# Patient Record
Sex: Male | Born: 1970
Health system: Southern US, Community
[De-identification: ages and names within clinical notes are randomized; demographics above are authoritative.]

## PROBLEM LIST (undated history)

## (undated) DIAGNOSIS — T7840XA Allergy, unspecified, initial encounter: Secondary | ICD-10-CM

## (undated) DIAGNOSIS — E669 Obesity, unspecified: Secondary | ICD-10-CM

## (undated) DIAGNOSIS — J302 Other seasonal allergic rhinitis: Secondary | ICD-10-CM

## (undated) DIAGNOSIS — K219 Gastro-esophageal reflux disease without esophagitis: Secondary | ICD-10-CM

## (undated) DIAGNOSIS — M25561 Pain in right knee: Secondary | ICD-10-CM

## (undated) DIAGNOSIS — I10 Essential (primary) hypertension: Secondary | ICD-10-CM

## (undated) DIAGNOSIS — F419 Anxiety disorder, unspecified: Secondary | ICD-10-CM

## (undated) DIAGNOSIS — J189 Pneumonia, unspecified organism: Secondary | ICD-10-CM

## (undated) DIAGNOSIS — M25562 Pain in left knee: Secondary | ICD-10-CM

## (undated) DIAGNOSIS — E785 Hyperlipidemia, unspecified: Secondary | ICD-10-CM

## (undated) DIAGNOSIS — M1710 Unilateral primary osteoarthritis, unspecified knee: Secondary | ICD-10-CM

## (undated) HISTORY — DX: Unilateral primary osteoarthritis, unspecified knee: M17.10

## (undated) HISTORY — DX: Pneumonia, unspecified organism: J18.9

## (undated) HISTORY — DX: Other seasonal allergic rhinitis: J30.2

## (undated) HISTORY — DX: Hyperlipidemia, unspecified: E78.5

## (undated) HISTORY — DX: Anxiety disorder, unspecified: F41.9

## (undated) HISTORY — DX: Gastro-esophageal reflux disease without esophagitis: K21.9

## (undated) HISTORY — DX: Obesity, unspecified: E66.9

## (undated) HISTORY — DX: Pain in left knee: M25.562

## (undated) HISTORY — DX: Essential (primary) hypertension: I10

## (undated) HISTORY — DX: Allergy, unspecified, initial encounter: T78.40XA

## (undated) HISTORY — DX: Pain in right knee: M25.561

---
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
## 2011-03-22 ENCOUNTER — Ambulatory Visit (INDEPENDENT_AMBULATORY_CARE_PROVIDER_SITE_OTHER): Payer: 59

## 2011-03-22 ENCOUNTER — Inpatient Hospital Stay (INDEPENDENT_AMBULATORY_CARE_PROVIDER_SITE_OTHER)
Admission: RE | Admit: 2011-03-22 | Discharge: 2011-03-22 | Disposition: A | Discharge status: Home / Self Care | Payer: 59 | Source: Ambulatory Visit | Attending: Family Medicine | Admitting: Family Medicine

## 2011-03-22 DIAGNOSIS — IMO0002 Reserved for concepts with insufficient information to code with codable children: Secondary | ICD-10-CM

## 2011-03-22 DIAGNOSIS — S61209A Unspecified open wound of unspecified finger without damage to nail, initial encounter: Secondary | ICD-10-CM

## 2012-01-17 ENCOUNTER — Ambulatory Visit: Payer: 59 | Admitting: Family Medicine

## 2012-01-24 ENCOUNTER — Ambulatory Visit (INDEPENDENT_AMBULATORY_CARE_PROVIDER_SITE_OTHER): Payer: 59 | Admitting: Family Medicine

## 2012-01-24 ENCOUNTER — Ambulatory Visit: Payer: 59 | Admitting: Family Medicine

## 2012-01-24 ENCOUNTER — Encounter: Payer: Self-pay | Admitting: Family Medicine

## 2012-01-24 VITALS — BP 120/86 | HR 105 | Temp 98.6°F | Ht 70.0 in | Wt 221.8 lb

## 2012-01-24 DIAGNOSIS — R000 Tachycardia, unspecified: Secondary | ICD-10-CM

## 2012-01-24 DIAGNOSIS — IMO0001 Reserved for inherently not codable concepts without codable children: Secondary | ICD-10-CM | POA: Insufficient documentation

## 2012-01-24 DIAGNOSIS — M1710 Unilateral primary osteoarthritis, unspecified knee: Secondary | ICD-10-CM

## 2012-01-24 DIAGNOSIS — E66811 Obesity, class 1: Secondary | ICD-10-CM | POA: Insufficient documentation

## 2012-01-24 DIAGNOSIS — J302 Other seasonal allergic rhinitis: Secondary | ICD-10-CM

## 2012-01-24 DIAGNOSIS — J309 Allergic rhinitis, unspecified: Secondary | ICD-10-CM

## 2012-01-24 DIAGNOSIS — K429 Umbilical hernia without obstruction or gangrene: Secondary | ICD-10-CM

## 2012-01-24 DIAGNOSIS — M25561 Pain in right knee: Secondary | ICD-10-CM | POA: Insufficient documentation

## 2012-01-24 DIAGNOSIS — E669 Obesity, unspecified: Secondary | ICD-10-CM

## 2012-01-24 MED ORDER — EPINEPHRINE 0.3 MG/0.3ML IJ DEVI: 0.3000 mg | Freq: Once | INTRAMUSCULAR | Status: DC

## 2012-01-24 NOTE — Assessment & Plan Note (Signed)
followed by [facility] [facility]. Has been rec against surgery. Continue aleve, tylenol prn

## 2012-01-24 NOTE — Assessment & Plan Note (Signed)
Check blood work when returns fasting. rec continue weight watchers.  Has lost 40lbs on this program. Body mass index is 31.82 kg/(m^2).

## 2012-01-24 NOTE — Patient Instructions (Signed)
For umbilical hernia- pass by Francisco Mullen's office for referral to surgery. No lifting anything more than 5-10 lbs until seen by surgery, take it easy until seen by them. Back off caffeine. Good job with weight, continue weight watchers. Return at your convenience fasting for blood work to check cholesterol and sugar. Great to meet you today, call us with questions.
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
## 2012-01-24 NOTE — Assessment & Plan Note (Signed)
Stable, continue to monitor  ?

## 2012-01-24 NOTE — Assessment & Plan Note (Signed)
Mild today. rec start by backing off caffeine.

## 2012-01-24 NOTE — Assessment & Plan Note (Signed)
Umbilical hernia present, reducible but tender - will refer to surgery for eval.  Likely will need repair.

## 2012-01-25 ENCOUNTER — Telehealth: Payer: Self-pay | Admitting: Family Medicine

## 2012-01-25 MED ORDER — TRAMADOL HCL 50 MG PO TABS
50.0000 mg | ORAL_TABLET | Freq: Two times a day (BID) | ORAL | Status: AC | PRN
Start: 2012-01-25 — End: 2012-02-04

## 2012-01-25 NOTE — Telephone Encounter (Signed)
plz check with pt - I'd like to review [facility] notes, but will likely feel comfortable prescribing narcotics for knee pain, unless pt definitely wants to establish with pain management. If wants [place] to follow knee pain, i'd like him to try tramadol first for knee pain instead of oxycodone - have sent in.

## 2012-01-25 NOTE — Telephone Encounter (Signed)
While scheduling the patients Surgeon consult to [facility] he asked about asking you to refer him for Pain Management to [facility] for Pain for his arthritic knees. He went to [facility] for 1 visit with xrays and the Dr there said he is too young for knee replacement. Having a lot of pain and could not take the Meloxicam that was prescribed for him. Have sent to [facility] for that Drs notes so we can send to the pain center as they will need these records.

## 2012-01-25 NOTE — Telephone Encounter (Signed)
Message left advising that you would like to see ortho notes, but that if you felt comfortable managing the pain afterwards, you would unless he specifically wants to go to pain management. Advised to let me know one way or the other how he would like to proceed. Advised tramadol was at pharmacy and to schedule a follow up here if he starts taking it.

## 2012-01-27 ENCOUNTER — Encounter: Payer: Self-pay | Admitting: Family Medicine

## 2012-01-31 ENCOUNTER — Telehealth: Payer: Self-pay | Admitting: *Deleted

## 2012-01-31 ENCOUNTER — Ambulatory Visit (INDEPENDENT_AMBULATORY_CARE_PROVIDER_SITE_OTHER): Payer: Commercial Managed Care - PPO | Admitting: General Surgery

## 2012-01-31 ENCOUNTER — Encounter (INDEPENDENT_AMBULATORY_CARE_PROVIDER_SITE_OTHER): Payer: Self-pay | Admitting: General Surgery

## 2012-01-31 VITALS — BP 150/96 | Ht 70.0 in | Wt 219.0 lb

## 2012-01-31 DIAGNOSIS — K429 Umbilical hernia without obstruction or gangrene: Secondary | ICD-10-CM
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
## 2012-01-31 NOTE — Telephone Encounter (Signed)
FMLA paperwork dropped off for patient's wife for pending hernia surgery. Paperwork in your IN box.

## 2012-01-31 NOTE — Telephone Encounter (Signed)
Filled and placed in my out box. 

## 2012-02-01 NOTE — Telephone Encounter (Signed)
Message left notifying patient's wife that paperwork was complete and ready for up. Copy made and original placed up front for pick up.

## 2012-02-13 ENCOUNTER — Ambulatory Visit (INDEPENDENT_AMBULATORY_CARE_PROVIDER_SITE_OTHER): Payer: Self-pay | Admitting: General Surgery

## 2012-02-15 ENCOUNTER — Encounter (HOSPITAL_BASED_OUTPATIENT_CLINIC_OR_DEPARTMENT_OTHER): Payer: Self-pay | Admitting: *Deleted

## 2012-02-15 NOTE — Progress Notes (Signed)
No labs needed

## 2012-02-20 ENCOUNTER — Encounter (HOSPITAL_BASED_OUTPATIENT_CLINIC_OR_DEPARTMENT_OTHER): Payer: Self-pay | Admitting: *Deleted

## 2012-02-20 ENCOUNTER — Encounter (HOSPITAL_BASED_OUTPATIENT_CLINIC_OR_DEPARTMENT_OTHER)
Admission: RE | Disposition: A | Discharge status: Home / Self Care | Payer: Self-pay | Source: Ambulatory Visit | Attending: General Surgery

## 2012-02-20 ENCOUNTER — Encounter (HOSPITAL_BASED_OUTPATIENT_CLINIC_OR_DEPARTMENT_OTHER): Payer: Self-pay | Admitting: Anesthesiology

## 2012-02-20 ENCOUNTER — Ambulatory Visit (HOSPITAL_BASED_OUTPATIENT_CLINIC_OR_DEPARTMENT_OTHER)
Admission: RE | Admit: 2012-02-20 | Discharge: 2012-02-20 | Disposition: A | Discharge status: Home / Self Care | Payer: 59 | Source: Ambulatory Visit | Attending: General Surgery | Admitting: General Surgery

## 2012-02-20 ENCOUNTER — Ambulatory Visit (HOSPITAL_BASED_OUTPATIENT_CLINIC_OR_DEPARTMENT_OTHER): Payer: 59 | Admitting: Anesthesiology

## 2012-02-20 DIAGNOSIS — Z87891 Personal history of nicotine dependence: Secondary | ICD-10-CM | POA: Insufficient documentation

## 2012-02-20 DIAGNOSIS — E669 Obesity, unspecified: Secondary | ICD-10-CM | POA: Insufficient documentation

## 2012-02-20 DIAGNOSIS — K429 Umbilical hernia without obstruction or gangrene: Secondary | ICD-10-CM

## 2012-02-20 HISTORY — PX: UMBILICAL HERNIA REPAIR: SHX196

## 2012-02-20 SURGERY — REPAIR, HERNIA, UMBILICAL, ADULT
Anesthesia: General | Site: Abdomen | Wound class: Clean

## 2012-02-20 MED ORDER — MIDAZOLAM HCL 5 MG/5ML IJ SOLN
INTRAMUSCULAR | Status: DC | PRN
  Administered 2012-02-20: 2 mg via INTRAVENOUS

## 2012-02-20 MED ORDER — CIPROFLOXACIN IN D5W 400 MG/200ML IV SOLN
400.0000 mg | INTRAVENOUS | Status: AC
  Administered 2012-02-20: 400 mg via INTRAVENOUS

## 2012-02-20 MED ORDER — FENTANYL CITRATE 0.05 MG/ML IJ SOLN
INTRAMUSCULAR | Status: DC | PRN
  Administered 2012-02-20: 25 ug via INTRAVENOUS
  Administered 2012-02-20: 100 ug via INTRAVENOUS
  Administered 2012-02-20: 25 ug via INTRAVENOUS

## 2012-02-20 MED ORDER — PROPOFOL 10 MG/ML IV EMUL
INTRAVENOUS | Status: DC | PRN
  Administered 2012-02-20: 200 mg via INTRAVENOUS

## 2012-02-20 MED ORDER — METOCLOPRAMIDE HCL 5 MG/ML IJ SOLN
INTRAMUSCULAR | Status: DC | PRN
  Administered 2012-02-20: 10 mg via INTRAVENOUS

## 2012-02-20 MED ORDER — HYDROCODONE-ACETAMINOPHEN 5-325 MG PO TABS
1.0000 | ORAL_TABLET | Freq: Once | ORAL | Status: AC
  Administered 2012-02-20: 1 via ORAL

## 2012-02-20 MED ORDER — ONDANSETRON HCL 4 MG/2ML IJ SOLN
INTRAMUSCULAR | Status: DC | PRN
  Administered 2012-02-20: 4 mg via INTRAVENOUS

## 2012-02-20 MED ORDER — DEXAMETHASONE SODIUM PHOSPHATE 4 MG/ML IJ SOLN
INTRAMUSCULAR | Status: DC | PRN
  Administered 2012-02-20: 10 mg via INTRAVENOUS

## 2012-02-20 MED ORDER — LACTATED RINGERS IV SOLN
INTRAVENOUS | Status: DC
  Administered 2012-02-20: 10:00:00 via INTRAVENOUS

## 2012-02-20 MED ORDER — BUPIVACAINE HCL (PF) 0.5 % IJ SOLN
INTRAMUSCULAR | Status: DC | PRN
  Administered 2012-02-20: 10 mL

## 2012-02-20 MED ORDER — SODIUM CHLORIDE 0.9 % IR SOLN
Status: DC | PRN
  Administered 2012-02-20: 11:00:00

## 2012-02-20 MED ORDER — LACTATED RINGERS IV SOLN
INTRAVENOUS | Status: DC | PRN
  Administered 2012-02-20 (×2): via INTRAVENOUS

## 2012-02-20 MED ORDER — HYDROCODONE-ACETAMINOPHEN 5-500 MG PO TABS: 1.0000 | ORAL_TABLET | Freq: Four times a day (QID) | ORAL | Status: AC | PRN

## 2012-02-20 MED ORDER — HYDROMORPHONE HCL PF 1 MG/ML IJ SOLN
0.2500 mg | INTRAMUSCULAR | Status: DC | PRN
  Administered 2012-02-20 (×3): 0.5 mg via INTRAVENOUS

## 2012-02-20 MED ORDER — ONDANSETRON HCL 4 MG/2ML IJ SOLN: 4.0000 mg | Freq: Four times a day (QID) | INTRAMUSCULAR | Status: DC | PRN

## 2012-02-20 SURGICAL SUPPLY — 63 items
BAG DECANTER FOR FLEXI CONT (MISCELLANEOUS) ×2 IMPLANT
BINDER ABD UNIV 12 30-45 (MISCELLANEOUS) ×1 IMPLANT
BINDER ABDOMINAL 12 (MISCELLANEOUS) ×2
BLADE SURG 10 STRL SS (BLADE) ×2 IMPLANT
BLADE SURG 15 STRL LF DISP TIS (BLADE) ×1 IMPLANT
BLADE SURG 15 STRL SS (BLADE) ×1
BLADE SURG ROTATE 9660 (MISCELLANEOUS) ×2 IMPLANT
CANISTER SUCTION 1200CC (MISCELLANEOUS) IMPLANT
CHLORAPREP W/TINT 26ML (MISCELLANEOUS) ×2 IMPLANT
CLEANER CAUTERY TIP 5X5 PAD (MISCELLANEOUS) ×1 IMPLANT
CLOTH BEACON ORANGE TIMEOUT ST (SAFETY) ×2 IMPLANT
CONT SPEC 4OZ CLIKSEAL STRL BL (MISCELLANEOUS) IMPLANT
COVER MAYO STAND STRL (DRAPES) ×2 IMPLANT
COVER TABLE BACK 60X90 (DRAPES) ×2 IMPLANT
DECANTER SPIKE VIAL GLASS SM (MISCELLANEOUS) IMPLANT
DERMABOND ADVANCED (GAUZE/BANDAGES/DRESSINGS) ×1
DERMABOND ADVANCED .7 DNX12 (GAUZE/BANDAGES/DRESSINGS) ×1 IMPLANT
DRAPE LAPAROTOMY TRNSV 102X78 (DRAPE) ×2 IMPLANT
DRAPE UTILITY XL STRL (DRAPES) ×4 IMPLANT
DRSG TEGADERM 2-3/8X2-3/4 SM (GAUZE/BANDAGES/DRESSINGS) IMPLANT
DRSG TEGADERM 4X4.75 (GAUZE/BANDAGES/DRESSINGS) ×2 IMPLANT
ELECT REM PT RETURN 9FT ADLT (ELECTROSURGICAL) ×2
ELECTRODE REM PT RTRN 9FT ADLT (ELECTROSURGICAL) ×1 IMPLANT
GLOVE BIO SURGEON STRL SZ 6.5 (GLOVE) ×2 IMPLANT
GLOVE BIOGEL PI IND STRL 8 (GLOVE) ×1 IMPLANT
GLOVE BIOGEL PI INDICATOR 8 (GLOVE) ×1
GLOVE ECLIPSE 7.5 STRL STRAW (GLOVE) ×2 IMPLANT
GOWN PREVENTION PLUS XLARGE (GOWN DISPOSABLE) ×4 IMPLANT
NEEDLE HYPO 25X1 1.5 SAFETY (NEEDLE) ×2 IMPLANT
NS IRRIG 1000ML POUR BTL (IV SOLUTION) IMPLANT
PACK BASIN DAY SURGERY FS (CUSTOM PROCEDURE TRAY) ×2 IMPLANT
PAD CLEANER CAUTERY TIP 5X5 (MISCELLANEOUS) ×1
PATCH VENTRAL SMALL 4.3 (Mesh Specialty) ×2 IMPLANT
PENCIL BUTTON HOLSTER BLD 10FT (ELECTRODE) ×2 IMPLANT
SLEEVE SCD COMPRESS KNEE MED (MISCELLANEOUS) ×2 IMPLANT
SPONGE INTESTINAL PEANUT (DISPOSABLE) IMPLANT
SPONGE LAP 4X18 X RAY DECT (DISPOSABLE) ×2 IMPLANT
STAPLER VISISTAT 35W (STAPLE) IMPLANT
STRIP CLOSURE SKIN 1/2X4 (GAUZE/BANDAGES/DRESSINGS) ×2 IMPLANT
SUT ETHIBOND 0 MO6 C/R (SUTURE) IMPLANT
SUT MNCRL AB 4-0 PS2 18 (SUTURE) ×2 IMPLANT
SUT NOVA NAB DX-16 0-1 5-0 T12 (SUTURE) IMPLANT
SUT NOVA NAB GS-21 0 18 T12 DT (SUTURE) ×2 IMPLANT
SUT PROLENE 0 SH 30 (SUTURE) IMPLANT
SUT PROLENE 1 CT (SUTURE) IMPLANT
SUT VIC AB 3-0 FS2 27 (SUTURE) IMPLANT
SUT VIC AB 3-0 SH 27 (SUTURE) ×2
SUT VIC AB 3-0 SH 27X BRD (SUTURE) ×2 IMPLANT
SUT VIC AB 4-0 RB1 27 (SUTURE)
SUT VIC AB 4-0 RB1 27X BRD (SUTURE) IMPLANT
SUT VIC AB 4-0 SH 27 (SUTURE)
SUT VIC AB 4-0 SH 27XANBCTRL (SUTURE) IMPLANT
SUT VIC AB 5-0 PS2 18 (SUTURE) IMPLANT
SUT VICRYL 4-0 PS2 18IN ABS (SUTURE) IMPLANT
SUT VICRYL AB 2 0 TIE (SUTURE) IMPLANT
SUT VICRYL AB 2 0 TIES (SUTURE)
SYR BULB 3OZ (MISCELLANEOUS) IMPLANT
SYR CONTROL 10ML LL (SYRINGE) ×2 IMPLANT
TOWEL OR 17X24 6PK STRL BLUE (TOWEL DISPOSABLE) IMPLANT
TOWEL OR NON WOVEN STRL DISP B (DISPOSABLE) ×2 IMPLANT
TUBE CONNECTING 20X1/4 (TUBING) IMPLANT
WATER STERILE IRR 1000ML POUR (IV SOLUTION) IMPLANT
YANKAUER SUCT BULB TIP NO VENT (SUCTIONS) IMPLANT

## 2012-02-20 NOTE — Anesthesia Procedure Notes (Signed)
Procedure Name: LMA Insertion Date/Time: 02/20/2012 10:54 AM Performed by: Caren Macadam Pre-anesthesia Checklist: Patient identified, Emergency Drugs available, Suction available and Patient being monitored Patient Re-evaluated:Patient Re-evaluated prior to inductionOxygen Delivery Method: Circle System Utilized Preoxygenation: Pre-oxygenation with 100% oxygen Intubation Type: IV induction Ventilation: Mask ventilation without difficulty LMA: LMA inserted LMA Size: 4.0 Number of attempts: 1 Airway Equipment and Method: bite block Placement Confirmation: positive ETCO2 and breath sounds checked- equal and bilateral Tube secured with: Tape Dental Injury: Teeth and Oropharynx as per pre-operative assessment

## 2012-02-20 NOTE — Transfer of Care (Signed)
Immediate Anesthesia Transfer of Care Note  Patient: Francisco Mullen  Procedure(s) Performed: Procedure(s) (LRB): HERNIA REPAIR UMBILICAL ADULT (N/A) INSERTION OF MESH (N/A)  Patient Location: PACU  Anesthesia Type: General  Level of Consciousness: awake and alert   Airway & Oxygen Therapy: Patient Spontanous Breathing and Patient connected to face mask oxygen  Post-op Assessment: Report given to PACU RN and Post -op Vital signs reviewed and stable  Post vital signs: Reviewed and stable  Complications: No apparent anesthesia complications

## 2012-02-20 NOTE — Discharge Instructions (Addendum)
Umbilical Herniorrhaphy A herniorrhaphy is surgery to repair a hernia. A hernia is a gap or weakness in the muscles of your abdomen. Umbilical means that your hernia is in the area around your belly button. You might be able to feel a small bulge in your abdomen where the hernia is. You might also have pain or discomfort. If the hernia is not repaired, the gap could get bigger. Your intestines could get trapped in the gap. This can be painful. It also can lead to other health problems, such as blocked intestines. LET YOUR CAREGIVER KNOW ABOUT:  Any allergies.   All medications you are taking, including:   Herbs, eyedrops, over-the-counter medications and cream   Blood thinners (anticoagulants), aspirin or other drugs that could affect blood clotting.   Use of steroids (by mouth or as creams).   Previous problems with anesthetics, including local anesthetics.   Possibility of pregnancy, if this applies.   Any history of blood clots.   Any history of bleeding or other blood problems.   Previous surgery.   Smoking history.   Other health problems.  RISKS AND COMPLICATIONS  Short-term possibilities include:   Pain.   Excessive bleeding.   Hematoma. This is a pooling of blood under the wound.   Infection at the surgery site or of the mesh.   Numbness at the surgery site.   Swelling and bruising.   Slow healing.   Blood clots.   Intestine or bowel damage. This is rare.   Longer-term possibilities include:   Scarring.   Skin damage.   The need for additional surgery.   Another hernia.  BEFORE THE PROCEDURE  Stop using aspirin and non-steroidal anti-inflammatory drugs (NSAIDs) for pain relief. Also stop taking vitamin E. If possible, do this two weeks in advance.   If you take blood-thinners, ask your healthcare provider when you should stop taking them.   Do not eat or drink for about 8 hours before your surgery.   You might be asked to shower or wash with a  special antibacterial soap before the procedure.   Wear clothes that will be easy to put on after the surgery.   Arrive at least an hour before the surgery, or whenever your surgeon recommends. This will give you time to check in and fill out any needed paperwork.   This surgery is usually an outpatient procedure, so you will be able to go home the same day. Less often people stay overnight in the hospital after the procedure. Ask your healthcare provider what to expect. Either way, make arrangements in advance for someone to drive you home. After an outpatient procedure, you should have someone stay with you overnight.  PROCEDURE Your procedure can be done with traditional surgery. The surgeon opens the abdomen and repairs the hernia. Or, sometimes it can be done with laparoscopic surgery. Then the procedure is done through multiple small incisions, using a camera to guide the repair. Talk with your surgeon about how the hernia will be repaired.  The preparation:   You will change into a hospital gown.   You will be given an IV. A needle will be inserted in your arm. Medication can flow directly into your body through this needle.   You might be given a sedative to help you relax.   You may be given a drug that will put you to sleep during the surgery (general anesthetic). Or, your abdomen will be numbed, and you will be drowsy but awake (local   anesthetic).   For a traditional surgery (sometimes called open surgery):   Once you are pain-free, the surgeon will make a small cut (incision) in your abdomen.   The gap in the muscle wall will be repaired. The surgeon could sew muscle together over the gap. Or, a mesh or soft screen material can be used to strengthen the area. The mesh acts as a scaffolding and the body grows new strong tissue into and around it. This new tissue is what closes the gap of the hernia and prevents it from coming back.   A drain might be put in. Fluid sometimes  collects under the wound as it heals. The drain helps get the fluid out of the area. A drain will probably be used if your hernia is large.   The surgeon will close the incision with small stitches.   For a laparoscopic surgery:   One you are pain-free, the surgeon will make a small incision in your abdomen.   A thin tube with a tiny camera attached to it will be inserted into the abdomen through the incision. What the camera "sees" is projected on a screen in the room. This gives the surgeon a good view of the hernia.   Other small incisions will be made so the surgeon can insert small tools that are used to repair the hernia.   The incisions will be closed with small stitches.  AFTER THE PROCEDURE  You will be stay in a recovery area until the anesthesia has worn off. Your blood pressure and pulse will be checked every so often.   You might be asked to get up and try walking.   Sometimes people can go home the same day. For others, an overnight stay is needed.  HOME CARE INSTRUCTIONS   Take any medication that your surgeon prescribed. Follow the directions carefully. Take all of the medication.   Ask your surgeon whether you can take over-the-counter medicines for pain, discomfort or fever. Do not take aspirin unless your healthcare team says that you should. Aspirin increases the chances of bleeding.   Do not get the incision area wet for the first few days after surgery (or until your surgeon says it is OK).   Avoid lifting heavy objects (more than 10 lbs, 4.5 kilograms) for 6 to 8 weeks after your surgery.   Expect some pain when climbing stairs for several days after surgery.   Avoid sexual activity for a few weeks. It can be uncomfortable or painful.   You should be able to drive within a few days. However, do not drive until you are no longer taking pain medicine. It can make you drowsy. It also can slow your reaction time.   You should be able to resume normal activity in  a few days. When you can return to work will depend on the type of work you do. You can go back to a desk job much sooner than you can return to work that requires physical labor. Talk about this with your healthcare provider.  SEEK MEDICAL CARE IF:   You notice blood or fluid leaking from the wound.   The area around the incision becomes red or swollen.   You are having problems urinating.   You become nauseous or throw up for more than two days after the surgery.   You develop a fever of more than 100.5 F (38.1 C).   Wear abdominal binder at all times while walking. SEEK IMMEDIATE MEDICAL CARE  IF:  You develop a fever of more than 102.0 F (38.9 C). Document Released: 01/06/2009 Document Revised: 09/29/2011 Document Reviewed: 01/06/2009 [facility] Patient Information 2012 [place], [place].   Post Anesthesia Home Care Instructions  Activity: Get plenty of rest for the remainder of the day. A responsible adult should stay with you for 24 hours following the procedure.  For the next 24 hours, DO NOT: -Drive a car -Advertising copywriter -Drink alcoholic beverages -Take any medication unless instructed by your physician -Make any legal decisions or sign important papers.  Meals: Start with liquid foods such as gelatin or soup. Progress to regular foods as tolerated. Avoid greasy, spicy, heavy foods. If nausea and/or vomiting occur, drink only clear liquids until the nausea and/or vomiting subsides. Call your physician if vomiting continues.  Special Instructions/Symptoms: Your throat may feel dry or sore from the anesthesia or the breathing tube placed in your throat during surgery. If this causes discomfort, gargle with warm salt water. The discomfort should disappear within 24 hours.

## 2012-02-20 NOTE — Op Note (Signed)
OPERATIVE REPORT  DATE OF OPERATION: 02/20/2012  PATIENT:  Francisco Mullen  41 y.Mullen. male  PRE-OPERATIVE DIAGNOSIS:  Symptomatic umbilical hernia  POST-OPERATIVE DIAGNOSIS:  Symptomatic umbilical hernia  PROCEDURE:  Procedure(s): HERNIA REPAIR UMBILICAL ADULT INSERTION OF MESH (4.3 cm Circular PROCEED Mesh)  SURGEON:  Surgeon(s): Cherylynn Ridges, MD  ASSISTANT: None   ANESTHESIA:   general  EBL: <30 ml  BLOOD ADMINISTERED: none  DRAINS: none   SPECIMEN:  No Specimen  COUNTS CORRECT:  YES  PROCEDURE DETAILS: The patient was taken to the operating room and placed on the table in the supine position. After an adequate general laryngeal airway anesthetic was administered he was prepped and draped in usual sterile manner exposing the midline.  An infraumbilical 5 cm long transverse incision was made using a #15 blade. It was taken down to subcutaneous tissue. The umbilical hernia was just underneath the umbilicus itself. We dissected this area free. I dissected the hernia sac which was attached to the bottom side of the umbilicus circumferentially. We then entered the sac and reduced the omentum which is contained within. We detached the omental attachments on the anterior surface of the abdominal wall. The edges of the fascia measured about 1.5-2 cm in size.  The hernia defect was fixed using a 4.3 cm circular patch of Proceed. It was attached to the underneath portion of the fascia using interrupted all Novafil sutures. Once this was done we closed the fascia on top of it after cutting off the flanges of the patch.  Once this was done the mesh which been soaked in antibiotic solution all was inspected and appeared to be fine. We irrigated the wound was antibiotic solution. We closed the subcutaneous tissue using 3-0 Vicryl sutures and the skin was closed using running subcuticular stitch of 4-0 Monocryl. 0.50 % Marcaine without epinephrine was injected into subcutaneous tissue prior  to closure. All counts were correct. Dermabond and Steri-Strips use complete the dressing  PATIENT DISPOSITION:  PACU - hemodynamically stable.   Francisco Mullen,Francisco Mullen 4/29/201311:52 AM

## 2012-02-20 NOTE — Anesthesia Postprocedure Evaluation (Signed)
Anesthesia Post Note  Patient: Francisco Mullen  Procedure(s) Performed: Procedure(s) (LRB): HERNIA REPAIR UMBILICAL ADULT (N/A) INSERTION OF MESH (N/A)  Anesthesia type: General  Patient location: PACU  Post pain: Pain level controlled and Adequate analgesia  Post assessment: Post-op Vital signs reviewed, Patient's Cardiovascular Status Stable, Respiratory Function Stable, Patent Airway and Pain level controlled  Last Vitals:  Filed Vitals:   02/20/12 1200  BP: 115/73  Pulse: 104  Temp:   Resp: 21    Post vital signs: Reviewed and stable  Level of consciousness: awake, alert  and oriented  Complications: No apparent anesthesia complications
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
## 2012-02-20 NOTE — Anesthesia Preprocedure Evaluation (Signed)
Anesthesia Evaluation  Patient identified by MRN, date of birth, ID band Patient awake    Reviewed: Allergy & Precautions, H&P , NPO status , Patient's Chart, lab work & pertinent test results  Airway Mallampati: II  Neck ROM: full    Dental   Pulmonary former smoker         Cardiovascular     Neuro/Psych    GI/Hepatic   Endo/Other    Renal/GU      Musculoskeletal  (+) Arthritis -,   Abdominal   Peds  Hematology   Anesthesia Other Findings   Reproductive/Obstetrics                           Anesthesia Physical Anesthesia Plan  ASA: II  Anesthesia Plan: General   Post-op Pain Management:    Induction: Intravenous  Airway Management Planned: Oral ETT  Additional Equipment:   Intra-op Plan:   Post-operative Plan: Extubation in OR  Informed Consent: I have reviewed the patients History and Physical, chart, labs and discussed the procedure including the risks, benefits and alternatives for the proposed anesthesia with the patient or authorized representative who has indicated his/her understanding and acceptance.     Plan Discussed with: CRNA and Surgeon  Anesthesia Plan Comments:         Anesthesia Quick Evaluation

## 2012-02-21 ENCOUNTER — Encounter (HOSPITAL_BASED_OUTPATIENT_CLINIC_OR_DEPARTMENT_OTHER): Payer: Self-pay | Admitting: General Surgery

## 2012-03-13 ENCOUNTER — Encounter (INDEPENDENT_AMBULATORY_CARE_PROVIDER_SITE_OTHER): Payer: Self-pay | Admitting: General Surgery

## 2012-03-13 ENCOUNTER — Ambulatory Visit (INDEPENDENT_AMBULATORY_CARE_PROVIDER_SITE_OTHER): Payer: Commercial Managed Care - PPO | Admitting: General Surgery

## 2012-03-13 VITALS — BP 162/78 | HR 76 | Temp 97.3°F | Resp 12 | Ht 70.0 in | Wt 226.2 lb

## 2012-03-13 DIAGNOSIS — Z09 Encounter for follow-up examination after completed treatment for conditions other than malignant neoplasm: Secondary | ICD-10-CM

## 2012-03-13 NOTE — Progress Notes (Signed)
HPI The patient is doing well status post umbilical hernia repair with mesh.  PE On examination he has no recurrence. There is some firmness and scarring in the supraumbilical area but no evidence of infection.  Study review None  Assessment Doing well status post open umbilical hernia repair with mesh.  Plan Return to see me on a p.r.n. basis. Device the patient that he can go back to full physical activity in one month.

## 2012-05-08 ENCOUNTER — Encounter: Payer: Self-pay | Admitting: Family Medicine

## 2012-05-08 ENCOUNTER — Ambulatory Visit (INDEPENDENT_AMBULATORY_CARE_PROVIDER_SITE_OTHER): Payer: 59 | Admitting: Family Medicine

## 2012-05-08 VITALS — BP 156/108 | HR 104 | Temp 97.8°F | Wt 230.5 lb

## 2012-05-08 DIAGNOSIS — R030 Elevated blood-pressure reading, without diagnosis of hypertension: Secondary | ICD-10-CM

## 2012-05-08 DIAGNOSIS — I10 Essential (primary) hypertension: Secondary | ICD-10-CM | POA: Insufficient documentation

## 2012-05-08 DIAGNOSIS — M1710 Unilateral primary osteoarthritis, unspecified knee: Secondary | ICD-10-CM

## 2012-05-08 DIAGNOSIS — K429 Umbilical hernia without obstruction or gangrene: Secondary | ICD-10-CM

## 2012-05-08 MED ORDER — HYDROCODONE-ACETAMINOPHEN 5-500 MG PO TABS: 1.0000 | ORAL_TABLET | Freq: Two times a day (BID) | ORAL | Status: DC | PRN
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
## 2012-05-08 NOTE — Patient Instructions (Addendum)
Schedule appointment with Dr. Jorge Mandril. vicodin for knee pain prescribed today.  Let me know how that is doing. Keep track of blood pressure at home.  If staying high despite better pain control, let me know. Return fasting for blood work. Good to see you today. Return in 3 months for follow up.

## 2012-05-08 NOTE — Assessment & Plan Note (Signed)
Has been elevated in past, also in pain. Will treat with vicodin, asked him to keep track at home of BP (wife can check) and update me if staying consistently elevated despite better pain control for initiation of antihypertensive.

## 2012-05-08 NOTE — Assessment & Plan Note (Signed)
S/p repair

## 2012-05-08 NOTE — Assessment & Plan Note (Signed)
Too young for knee replacement.  Has been evaluated by Dr. Jorge Mandril in past, advised reschedule checkup with him, discuss steroid shot vs visco-supplementation Has tried multiple NSAIDs as well as OTC meds.  Tramadol ineffective. I feel comfortable treating with narcotic, discussed dependence/tolerance of med. Will start with vicodin, rtc 3 mo for f/u.

## 2012-05-10 ENCOUNTER — Other Ambulatory Visit (INDEPENDENT_AMBULATORY_CARE_PROVIDER_SITE_OTHER): Payer: 59

## 2012-05-10 DIAGNOSIS — E669 Obesity, unspecified: Secondary | ICD-10-CM

## 2012-05-10 LAB — COMPREHENSIVE METABOLIC PANEL
ALT: 36 U/L (ref 0–53)
Alkaline Phosphatase: 60 U/L (ref 39–117)
Sodium: 140 mEq/L (ref 135–145)
Total Bilirubin: 0.6 mg/dL (ref 0.3–1.2)
Total Protein: 7.1 g/dL (ref 6.0–8.3)

## 2012-05-10 LAB — LIPID PANEL
HDL: 32.2 mg/dL — ABNORMAL LOW (ref >39.00)
Total CHOL/HDL Ratio: 6
Triglycerides: 376 mg/dL — ABNORMAL HIGH (ref 0.0–149.0)

## 2012-05-10 LAB — TSH: TSH: 3.63 u[IU]/mL (ref 0.35–5.50)

## 2012-06-01 ENCOUNTER — Other Ambulatory Visit: Payer: Self-pay

## 2012-06-01 MED ORDER — HYDROCODONE-ACETAMINOPHEN 5-500 MG PO TABS: 1.0000 | ORAL_TABLET | Freq: Two times a day (BID) | ORAL | Status: DC | PRN

## 2012-06-01 NOTE — Telephone Encounter (Signed)
plz phone in and notify pt. 

## 2012-06-01 NOTE — Telephone Encounter (Signed)
pts wife request refill hydrocodone to [facility] [address] on Monday.Please advise.

## 2012-06-04 NOTE — Telephone Encounter (Signed)
Rx called in as directed.   

## 2012-06-05 ENCOUNTER — Other Ambulatory Visit: Payer: Self-pay

## 2012-06-05 NOTE — Telephone Encounter (Signed)
pts wife left v/m requesting refill Vicodin. Spoke wit Missy at [facility] [address].  Vicodin called in 06/04/12 and is ready for pick upl. Left v/m for pt or pts wife to call back.

## 2012-06-07 NOTE — Telephone Encounter (Signed)
Spoke with pt he did get med at [facility].

## 2012-07-04 ENCOUNTER — Other Ambulatory Visit: Payer: Self-pay

## 2012-07-04 MED ORDER — HYDROCODONE-ACETAMINOPHEN 5-500 MG PO TABS: 1.0000 | ORAL_TABLET | Freq: Two times a day (BID) | ORAL | Status: AC | PRN

## 2012-07-04 NOTE — Telephone Encounter (Signed)
plz phone in. Has he scheduled recheck with Dr. Jorge Mandril?

## 2012-07-04 NOTE — Telephone Encounter (Signed)
pts wife left v/m requesting refill Vicodin to [facility] [address]. Request call back when med called in.

## 2012-07-05 NOTE — Telephone Encounter (Signed)
Rx called in as directed. Spoke with patient's wife and she thinks patient may have actually forgotten to see Dr. Jorge Mandril. She will remind him to follow up with him since he shouldn't be on pain meds long term.

## 2012-08-07 ENCOUNTER — Telehealth: Payer: Self-pay | Admitting: *Deleted

## 2012-08-07 NOTE — Telephone Encounter (Signed)
Patient left message on triage VM asking for a return call to give an update on the Cortisone shot to his knee and to get a refill on his Rx.  Says he is going back on the 28th.

## 2012-08-08 ENCOUNTER — Ambulatory Visit: Payer: 59 | Admitting: Family Medicine

## 2012-08-08 DIAGNOSIS — Z0289 Encounter for other administrative examinations: Secondary | ICD-10-CM

## 2012-08-08 NOTE — Telephone Encounter (Signed)
Wife returned my call and had to leave message. She said he will have to reschedule appt.

## 2012-08-08 NOTE — Telephone Encounter (Signed)
Message left for patient to return my call. Advised patient that he had appt this AM that he no showed for at 8 AM and if he was needing refills on pain meds, he would definitely need to be seen, since we haven't seen him in 3 months. Will await return call.

## 2012-09-25 ENCOUNTER — Encounter: Payer: Self-pay | Admitting: Family Medicine

## 2012-09-25 ENCOUNTER — Ambulatory Visit (INDEPENDENT_AMBULATORY_CARE_PROVIDER_SITE_OTHER): Payer: 59 | Admitting: Family Medicine

## 2012-09-25 VITALS — BP 140/90 | HR 84 | Temp 97.8°F | Wt 233.8 lb

## 2012-09-25 DIAGNOSIS — J069 Acute upper respiratory infection, unspecified: Secondary | ICD-10-CM

## 2012-09-25 DIAGNOSIS — R030 Elevated blood-pressure reading, without diagnosis of hypertension: Secondary | ICD-10-CM

## 2012-09-25 DIAGNOSIS — M1710 Unilateral primary osteoarthritis, unspecified knee: Secondary | ICD-10-CM

## 2012-09-25 MED ORDER — HYDROCODONE-ACETAMINOPHEN 5-325 MG PO TABS: 1.0000 | ORAL_TABLET | Freq: Three times a day (TID) | ORAL | Status: DC | PRN

## 2012-09-25 NOTE — Progress Notes (Signed)
  Subjective:    Patient ID: Francisco Mullen, male    DOB: [birth year], 41 y.o.   MRN: [id number]  HPI CC: f/u knee pain and head cold  Knee OA - seen by Dr. Jorge Mandril - cortisone shot didn't help.  Then seen by Dr. Lajoyce Corners - treated with SUS (viscosupplementation) 3 shot series - last was 2 wks ago.  Not noticing any improvement with viscosupplementation.  Discussed possible arthroscopy, rec against replacement 2/2 young age.  Was on vicodin but cause "skin crawling" sensation.  Also has tried tramadol which didn't really help. Thinks may need to find indoor job 2/2 knee pain.  Wt Readings from Last 3 Encounters:  09/25/12 233 lb 12 oz (106.028 kg)  05/08/12 230 lb 8 oz (104.554 kg)  03/13/12 226 lb 4 oz (102.626 kg)  Noted weight gain from 208lbs since after umbilical hernia repair.   Issue with portion size.  Trouble staying active 2/2 knee. Discussed exercise - bike vs aquatics.  Head cold - for 3-4 days.  Taking pseudophed and tylenol.  Staying hydrated.  + PNdrainage and head congestion, HA. No fevers/chills, coughing, ear or tooth pain.  Past Medical History  Diagnosis Date  . Patellofemoral arthritis     R>L, seen by Dr. Prince Rome, consider steroid injection vs viscosupplementation  . Seasonal allergies     spring  . Obesity   . Arthritis     knees     Review of Systems Per HPI    Objective:   Physical Exam  Nursing note and vitals reviewed. Constitutional: He appears well-developed and well-nourished. No distress.  HENT:  Head: Normocephalic and atraumatic.  Right Ear: Hearing, tympanic membrane, external ear and ear canal normal.  Left Ear: Hearing, tympanic membrane, external ear and ear canal normal.  Nose: Nose normal. No mucosal edema or rhinorrhea. Right sinus exhibits no maxillary sinus tenderness and no frontal sinus tenderness. Left sinus exhibits no maxillary sinus tenderness and no frontal sinus tenderness.  Mouth/Throat: Uvula is midline, oropharynx is clear and  moist and mucous membranes are normal. No oropharyngeal exudate, posterior oropharyngeal edema, posterior oropharyngeal erythema or tonsillar abscesses.  Eyes: Conjunctivae normal and EOM are normal. Pupils are equal, round, and reactive to light. No scleral icterus.  Neck: Normal range of motion. Neck supple. Carotid bruit is not present.  Cardiovascular: Normal rate, regular rhythm, normal heart sounds and intact distal pulses.   No murmur heard. Pulmonary/Chest: Effort normal and breath sounds normal. No respiratory distress. He has no wheezes. He has no rales.  Lymphadenopathy:    He has no cervical adenopathy.  Skin: Skin is warm and dry. No rash noted.       Assessment & Plan:

## 2012-09-25 NOTE — Assessment & Plan Note (Signed)
Stable.  Has been taking pseudophed.  Continue to monitor.

## 2012-09-25 NOTE — Assessment & Plan Note (Signed)
No red flags.  Anticipate viral.  See pt instructions.  discussed reasons to seek further care.

## 2012-09-25 NOTE — Assessment & Plan Note (Signed)
Followed now by Dr. Lajoyce Corners.  Considering arthroscopy. Discussed narcotics - pros and cons.  Will prescribe vicodin.  rtc3 mo for f/u.  Consider trial of morphine. Stressed importance of weight loss and staying active.

## 2012-09-25 NOTE — Patient Instructions (Signed)
vicodin prescription today.  Return in 3 months for follow up. Good to see you today, call us with questions. You have a head cold, likely viral as early on. Push fluids and plenty of rest. Nasal saline irrigation or neti pot to help drain sinuses. May use simple mucinex with plenty of fluid to help mobilize mucous. Let us know if fever >101.5, trouble opening/closing mouth, difficulty swallowing, or worsening - you may need to be seen again.

## 2012-10-25 ENCOUNTER — Other Ambulatory Visit: Payer: Self-pay

## 2012-10-25 MED ORDER — HYDROCODONE-ACETAMINOPHEN 5-325 MG PO TABS: 1.0000 | ORAL_TABLET | Freq: Three times a day (TID) | ORAL | Status: DC | PRN

## 2012-10-25 NOTE — Telephone Encounter (Signed)
pts wife left v/m requesting refill vicodin [facility] [address].Please advise.pts wife request call back when called in.

## 2012-10-25 NOTE — Telephone Encounter (Signed)
plz phone in and notify pt. 

## 2012-10-26 NOTE — Telephone Encounter (Signed)
Rx called in as directed. Message left notifying patient's wife.

## 2012-11-22 ENCOUNTER — Telehealth: Payer: Self-pay | Admitting: Family Medicine

## 2012-11-22 MED ORDER — HYDROCODONE-ACETAMINOPHEN 5-325 MG PO TABS: 1.0000 | ORAL_TABLET | Freq: Three times a day (TID) | ORAL | Status: DC | PRN

## 2012-11-22 NOTE — Telephone Encounter (Signed)
Patient is going out of town tonite and is requesting a refill on his pain med vicodin. He will gone all next week so is requesting his RX a little early. Please call the patient on his cell today if this can be done. Call [phone] his cell.

## 2012-11-22 NOTE — Telephone Encounter (Signed)
plz phone in and notify pt. 

## 2012-11-22 NOTE — Telephone Encounter (Signed)
Rx called in as directed and message left advising patient.  

## 2012-12-24 ENCOUNTER — Ambulatory Visit (INDEPENDENT_AMBULATORY_CARE_PROVIDER_SITE_OTHER): Payer: 59 | Admitting: Family Medicine

## 2012-12-24 ENCOUNTER — Encounter: Payer: Self-pay | Admitting: Family Medicine

## 2012-12-24 VITALS — BP 140/100 | HR 72 | Temp 97.5°F | Wt 231.0 lb

## 2012-12-24 DIAGNOSIS — R030 Elevated blood-pressure reading, without diagnosis of hypertension: Secondary | ICD-10-CM

## 2012-12-24 DIAGNOSIS — M1710 Unilateral primary osteoarthritis, unspecified knee: Secondary | ICD-10-CM

## 2012-12-24 DIAGNOSIS — G4700 Insomnia, unspecified: Secondary | ICD-10-CM

## 2012-12-24 MED ORDER — HYDROCODONE-ACETAMINOPHEN 10-325 MG PO TABS: 1.0000 | ORAL_TABLET | Freq: Three times a day (TID) | ORAL | Status: DC | PRN

## 2012-12-24 MED ORDER — TRAZODONE HCL 50 MG PO TABS: 25.0000 mg | ORAL_TABLET | Freq: Every evening | ORAL | Status: DC | PRN

## 2012-12-24 MED ORDER — IBUPROFEN 800 MG PO TABS: 800.0000 mg | ORAL_TABLET | Freq: Three times a day (TID) | ORAL | Status: DC | PRN

## 2012-12-24 NOTE — Patient Instructions (Addendum)
Start checking blood pressure at home.  If consistently >140/90, let me know. Trial of stronger vicodin. Ibuprofen sent in. Trial of trazodone at bedtime for sleep. Let me know if you need referral for Dr. Despina Hick.  Insomnia Insomnia is frequent trouble falling and/or staying asleep. Insomnia can be a long term problem or a short term problem. Both are common. Insomnia can be a short term problem when the wakefulness is related to a certain stress or worry. Long term insomnia is often related to ongoing stress during waking hours and/or poor sleeping habits. Overtime, sleep deprivation itself can make the problem worse. Every little thing feels more severe because you are overtired and your ability to cope is decreased. CAUSES   Stress, anxiety, and depression.  Poor sleeping habits.  Distractions such as TV in the bedroom.  Naps close to bedtime.  Engaging in emotionally charged conversations before bed.  Technical reading before sleep.  Alcohol and other sedatives. They may make the problem worse. They can hurt normal sleep patterns and normal dream activity.  Stimulants such as caffeine for several hours prior to bedtime.  Pain syndromes and shortness of breath can cause insomnia.  Exercise late at night.  Changing time zones may cause sleeping problems (jet lag). It is sometimes helpful to have someone observe your sleeping patterns. They should look for periods of not breathing during the night (sleep apnea). They should also look to see how long those periods last. If you live alone or observers are uncertain, you can also be observed at a sleep clinic where your sleep patterns will be professionally monitored. Sleep apnea requires a checkup and treatment. Give your caregivers your medical history. Give your caregivers observations your family has made about your sleep.  SYMPTOMS   Not feeling rested in the morning.  Anxiety and restlessness at bedtime.  Difficulty falling  and staying asleep. TREATMENT   Your caregiver may prescribe treatment for an underlying medical disorders. Your caregiver can give advice or help if you are using alcohol or other drugs for self-medication. Treatment of underlying problems will usually eliminate insomnia problems.  Medications can be prescribed for short time use. They are generally not recommended for lengthy use.  Over-the-counter sleep medicines are not recommended for lengthy use. They can be habit forming.  You can promote easier sleeping by making lifestyle changes such as:  Using relaxation techniques that help with breathing and reduce muscle tension.  Exercising earlier in the day.  Changing your diet and the time of your last meal. No night time snacks.  Establish a regular time to go to bed.  Counseling can help with stressful problems and worry.  Soothing music and white noise may be helpful if there are background noises you cannot remove.  Stop tedious detailed work at least one hour before bedtime. HOME CARE INSTRUCTIONS   Keep a diary. Inform your caregiver about your progress. This includes any medication side effects. See your caregiver regularly. Take note of:  Times when you are asleep.  Times when you are awake during the night.  The quality of your sleep.  How you feel the next day. This information will help your caregiver care for you.  Get out of bed if you are still awake after 15 minutes. Read or do some quiet activity. Keep the lights down. Wait until you feel sleepy and go back to bed.  Keep regular sleeping and waking hours. Avoid naps.  Exercise regularly.  Avoid distractions at bedtime. Distractions  include watching television or engaging in any intense or detailed activity like attempting to balance the household checkbook.  Develop a bedtime ritual. Keep a familiar routine of bathing, brushing your teeth, climbing into bed at the same time each night, listening to  soothing music. Routines increase the success of falling to sleep faster.  Use relaxation techniques. This can be using breathing and muscle tension release routines. It can also include visualizing peaceful scenes. You can also help control troubling or intruding thoughts by keeping your mind occupied with boring or repetitive thoughts like the old concept of counting sheep. You can make it more creative like imagining planting one beautiful flower after another in your backyard garden.  During your day, work to eliminate stress. When this is not possible use some of the previous suggestions to help reduce the anxiety that accompanies stressful situations. MAKE SURE YOU:   Understand these instructions.  Will watch your condition.  Will get help right away if you are not doing well or get worse. Document Released: 10/07/2000 Document Revised: 01/02/2012 Document Reviewed: 11/07/2007 [facility] Patient Information 2013 [place], [place].
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
## 2012-12-25 ENCOUNTER — Encounter: Payer: Self-pay | Admitting: Family Medicine

## 2012-12-25 DIAGNOSIS — G4700 Insomnia, unspecified: Secondary | ICD-10-CM | POA: Insufficient documentation

## 2012-12-25 NOTE — Assessment & Plan Note (Signed)
Both sleep initiation and sleep maintenance insomnia.   Knee pain contributing some ("unable to get comfortable") Discussed sleep hygiene measures and provided with handout. Will do trial of trazodone for sleep.

## 2012-12-25 NOTE — Assessment & Plan Note (Signed)
Remains elevated.  Wife states normal BP at home. I asked them to start monitoring blood pressure at home and notify me if consistently elevated >140/90 to start medication.

## 2012-12-25 NOTE — Assessment & Plan Note (Addendum)
Has seen Dr. Jorge Mandril and Lajoyce Corners in past - was considering arthroscopy.  Requests 2nd opinion with Dr. Despina Hick - will call to schedule this, notify me if needs referral. Will increase vicodin to 10/325 dose. I also refilled ibuprofen 800mg  prn.

## 2013-01-14 ENCOUNTER — Telehealth: Payer: Self-pay

## 2013-01-14 NOTE — Telephone Encounter (Signed)
Pt was to get appt with Dr Leonette Monarch at [facility] orthopedics about his knees. Pt needs medical records in regards to pain management info sent  to Dr Leonette Monarch. Pt cannot get appt until June.Pt request call back.

## 2013-01-14 NOTE — Telephone Encounter (Signed)
Notes faxed to [facility] at number listed below.

## 2013-01-14 NOTE — Telephone Encounter (Signed)
Spoke with patient.  He requests records sent to Dr. Leonette Monarch. Selena Batten, can we send last 3 office notes to Aluzio's office? Fax attention [facility] at [phone] Thanks.

## 2013-01-22 ENCOUNTER — Other Ambulatory Visit: Payer: Self-pay

## 2013-01-22 NOTE — Telephone Encounter (Signed)
Pt left v/m requesting refill hydrocodone apap and ibuprofen to [facility] [address].Please advise.

## 2013-01-23 MED ORDER — HYDROCODONE-ACETAMINOPHEN 10-325 MG PO TABS: 1.0000 | ORAL_TABLET | Freq: Three times a day (TID) | ORAL | Status: DC | PRN

## 2013-01-23 MED ORDER — IBUPROFEN 800 MG PO TABS: 800.0000 mg | ORAL_TABLET | Freq: Three times a day (TID) | ORAL | Status: DC | PRN

## 2013-01-23 NOTE — Telephone Encounter (Signed)
plz phone in. 

## 2013-01-23 NOTE — Telephone Encounter (Signed)
Rx called in as directed.   

## 2013-02-18 ENCOUNTER — Other Ambulatory Visit: Payer: Self-pay

## 2013-02-18 MED ORDER — HYDROCODONE-ACETAMINOPHEN 10-325 MG PO TABS: 1.0000 | ORAL_TABLET | Freq: Three times a day (TID) | ORAL | Status: DC | PRN

## 2013-02-18 NOTE — Telephone Encounter (Signed)
Please call in the hydrocodone. He should have refills on the ibuprofen.  Have him call the ortho clinic.  It appears that records were sent.   Let me know if he needs another referral started.   Thanks.

## 2013-02-18 NOTE — Telephone Encounter (Signed)
Pt left v/m requesting refill on hydrocodone apap and ibuprofen to [facility] [address]. Pt has not heard about orthopedic appt re: knees.Please advise.

## 2013-02-19 NOTE — Telephone Encounter (Signed)
Rx called in as directed. Message left advising patient to check with ortho about appt and call me back if he needs another referral.

## 2013-03-20 ENCOUNTER — Other Ambulatory Visit: Payer: Self-pay

## 2013-03-20 NOTE — Telephone Encounter (Signed)
Pt request refill hydrocodone apap to [facility] [address].Pt wanted Dr Reece Agar to know Dr Despina Hick has not decided if he is going to do 2nd opinion re; pts knee pain.

## 2013-03-21 MED ORDER — HYDROCODONE-ACETAMINOPHEN 10-325 MG PO TABS: 1.0000 | ORAL_TABLET | Freq: Three times a day (TID) | ORAL | Status: DC | PRN

## 2013-03-21 NOTE — Telephone Encounter (Signed)
plz phone in. 

## 2013-03-21 NOTE — Telephone Encounter (Signed)
Rx called in as directed.   

## 2013-04-11 ENCOUNTER — Telehealth: Payer: Self-pay

## 2013-04-11 NOTE — Telephone Encounter (Signed)
Pt saw Dr Antony Odea and wanted to let Dr Reece Agar know does not need knee replacement but has torn ligaments and tendons rt knee. Pt being scheduled for MRI and then recheck by Dr Antony Odea. Pt is to continue with Dr Reece Agar for pain management. Pt said no call back needed unless Dr Reece Agar wanted to talk with pt.

## 2013-04-11 NOTE — Telephone Encounter (Signed)
Noted. Thanks.

## 2013-04-17 ENCOUNTER — Other Ambulatory Visit (HOSPITAL_COMMUNITY): Payer: Self-pay | Admitting: Orthopedic Surgery

## 2013-04-17 DIAGNOSIS — M25562 Pain in left knee: Secondary | ICD-10-CM

## 2013-04-19 ENCOUNTER — Ambulatory Visit (HOSPITAL_COMMUNITY): Payer: 59

## 2013-04-19 ENCOUNTER — Telehealth: Payer: Self-pay | Admitting: Family Medicine

## 2013-04-19 ENCOUNTER — Other Ambulatory Visit: Payer: Self-pay | Admitting: Family Medicine

## 2013-04-19 NOTE — Telephone Encounter (Signed)
Pt called to check status of refill, please call once complete

## 2013-04-19 NOTE — Telephone Encounter (Signed)
Pt called just to give you an update, pt has MRI scheduled on 04/29/13 and then a follow-up with Dr. Lequita Halt on 06/10/13

## 2013-04-19 NOTE — Telephone Encounter (Signed)
Ok to refill in Dr. G's absence? 

## 2013-04-19 NOTE — Telephone Encounter (Signed)
Noted thanks °

## 2013-04-21 NOTE — Telephone Encounter (Signed)
Please call in.  We need a business day to fill this med.

## 2013-04-22 NOTE — Telephone Encounter (Signed)
Medication phoned to pharmacy.  

## 2013-04-25 ENCOUNTER — Encounter: Payer: Self-pay | Admitting: Family Medicine

## 2013-04-29 ENCOUNTER — Ambulatory Visit (HOSPITAL_COMMUNITY)
Admission: RE | Admit: 2013-04-29 | Discharge: 2013-04-29 | Disposition: A | Discharge status: Home / Self Care | Payer: 59 | Source: Ambulatory Visit | Attending: Orthopedic Surgery | Admitting: Orthopedic Surgery

## 2013-04-29 DIAGNOSIS — M25562 Pain in left knee: Secondary | ICD-10-CM

## 2013-05-08 ENCOUNTER — Telehealth: Payer: Self-pay | Admitting: Family Medicine

## 2013-05-08 MED ORDER — DIAZEPAM 5 MG PO TABS: 5.0000 mg | ORAL_TABLET | Freq: Two times a day (BID) | ORAL | Status: DC | PRN

## 2013-05-08 NOTE — Telephone Encounter (Signed)
Saw wife with daughter today - she states Ludger had trouble with MRI 2/2 anxiety, had to cancel procedure. Requests benzo peri-imaging. Selena Batten, plz phone in valium script to pharmacy.  Thanks.

## 2013-05-08 NOTE — Telephone Encounter (Signed)
Rx called in as directed.   

## 2013-05-09 ENCOUNTER — Other Ambulatory Visit (HOSPITAL_COMMUNITY): Payer: Self-pay | Admitting: Orthopedic Surgery

## 2013-05-09 DIAGNOSIS — M25561 Pain in right knee: Secondary | ICD-10-CM

## 2013-05-15 ENCOUNTER — Ambulatory Visit (HOSPITAL_COMMUNITY)
Admission: RE | Admit: 2013-05-15 | Discharge: 2013-05-15 | Disposition: A | Discharge status: Home / Self Care | Payer: 59 | Source: Ambulatory Visit | Attending: Orthopedic Surgery | Admitting: Orthopedic Surgery

## 2013-05-15 DIAGNOSIS — M25562 Pain in left knee: Secondary | ICD-10-CM

## 2013-05-15 DIAGNOSIS — M25561 Pain in right knee: Secondary | ICD-10-CM

## 2013-05-17 ENCOUNTER — Telehealth: Payer: Self-pay | Admitting: Family Medicine

## 2013-05-17 MED ORDER — DICLOFENAC SODIUM 1 % TD GEL: 1.0000 "application " | Freq: Three times a day (TID) | TRANSDERMAL | Status: DC

## 2013-05-17 MED ORDER — HYDROCODONE-ACETAMINOPHEN 10-325 MG PO TABS: ORAL_TABLET | ORAL | Status: DC

## 2013-05-17 NOTE — Telephone Encounter (Signed)
Saw wife and son today - wife requests refill of vicodin.  Will also do trial of voltaren gel for pain.  Has not tolerated oral NSAIDs well in past

## 2013-06-19 ENCOUNTER — Other Ambulatory Visit: Payer: Self-pay

## 2013-06-19 MED ORDER — HYDROCODONE-ACETAMINOPHEN 10-325 MG PO TABS: ORAL_TABLET | ORAL | Status: DC

## 2013-06-19 MED ORDER — DICLOFENAC SODIUM 1 % TD GEL: 1.0000 "application " | Freq: Three times a day (TID) | TRANSDERMAL | Status: DC

## 2013-06-19 NOTE — Telephone Encounter (Signed)
Rx called in as directed and message left notifying patients wife.

## 2013-06-19 NOTE — Telephone Encounter (Signed)
plz phone in and notify patient. voltaren gel sent in.

## 2013-06-19 NOTE — Telephone Encounter (Signed)
pts wife left v/m requesting refill hydrocodone apap and voltaren gel to [facility] [address]. Mrs Carruthers said pt is out of refills.Please advise. Mrs Fortunato request cb.

## 2013-07-22 ENCOUNTER — Other Ambulatory Visit: Payer: Self-pay

## 2013-07-22 MED ORDER — HYDROCODONE-ACETAMINOPHEN 10-325 MG PO TABS: ORAL_TABLET | ORAL | Status: DC

## 2013-07-22 NOTE — Telephone Encounter (Signed)
Rx called in as directed.   

## 2013-07-22 NOTE — Telephone Encounter (Signed)
Francisco Mullen left v/m requesting refill Vicodin to [facility] [address]. Pt to see Dr Traci Sermon end of Oct; previous appt rescheduled due to pt attending a funeral..Please advise.

## 2013-07-22 NOTE — Telephone Encounter (Signed)
plz phone in. 

## 2013-08-15 ENCOUNTER — Encounter: Payer: Self-pay | Admitting: Radiology

## 2013-08-16 ENCOUNTER — Encounter: Payer: Self-pay | Admitting: Family Medicine

## 2013-08-16 ENCOUNTER — Encounter: Payer: Self-pay | Admitting: *Deleted

## 2013-08-16 ENCOUNTER — Ambulatory Visit (INDEPENDENT_AMBULATORY_CARE_PROVIDER_SITE_OTHER): Payer: 59 | Admitting: Family Medicine

## 2013-08-16 VITALS — BP 150/96 | HR 80 | Temp 97.8°F | Wt 225.5 lb

## 2013-08-16 DIAGNOSIS — M25561 Pain in right knee: Secondary | ICD-10-CM

## 2013-08-16 DIAGNOSIS — Z23 Encounter for immunization: Secondary | ICD-10-CM

## 2013-08-16 DIAGNOSIS — M25569 Pain in unspecified knee: Secondary | ICD-10-CM

## 2013-08-16 DIAGNOSIS — I10 Essential (primary) hypertension: Secondary | ICD-10-CM

## 2013-08-16 MED ORDER — HYDROCODONE-ACETAMINOPHEN 10-325 MG PO TABS: ORAL_TABLET | ORAL | Status: DC

## 2013-08-16 NOTE — Progress Notes (Signed)
  Subjective:    Patient ID: Francisco Mullen, male    DOB: 1971/10/16, 42 y.o.   MRN: [id number]  HPI CC: f/u knees  Longstanding history of bilateral knee pain thought due to PF arthritis/degeneration.  Initially followed by Dr. Jorge Mandril then seen by Dr. Despina Hick.  ?meniscal tear of R knee.  Failed to complete 2 MRIs - 2/2 knee discomfort despite valium and hydrocodone.  Has f/u appt with Dr. Roswell Miners next month (had to cancel initial appointment).  I have been prescribing hydrocodone (last refill 07/22/2013 #60 to take TID PRN) and voltaren gel.  Also takes ibuprofen 800mg .  Does not take extra tylenol.  voltaren gel does help as well.  Severe knee pain limits ability to function at work and at home.  Worse with walking up stairs or ladders (which is what his job entails).  Lab Results  Component Value Date   CREATININE 1.1 05/10/2012    Wt Readings from Last 3 Encounters:  08/16/13 225 lb 8 oz (102.286 kg)  12/24/12 231 lb (104.781 kg)  09/25/12 233 lb 12 oz (106.028 kg)    BP Readings from Last 3 Encounters:  08/16/13 150/96  12/24/12 140/100  09/25/12 140/90  bp elevated today - at home runs high too occasionally.  Past Medical History  Diagnosis Date  . Patellofemoral arthritis     R>L, seen by Dr. Prince Rome, consider steroid injection vs viscosupplementation  . Seasonal allergies     spring  . Obesity   . Knee pain, bilateral     R>L, actually thought minimal arthritis, rather meniscal injury MRI ordered (Dr Despina Hick)    Review of Systems Per HPI    Objective:   Physical Exam  Nursing note and vitals reviewed. Constitutional: He appears well-developed and well-nourished. No distress.  Musculoskeletal: He exhibits no edema.  Antalgic gait No palpable cords Very tender to palpation even to touch right popliteal area, some swelling noted as well at that area. Mild discomfort anterior knees bilaterally at PF region. No deformity or anterior knee swelling/effusion  Skin: Skin  is warm and dry. No rash noted.       Assessment & Plan:

## 2013-08-16 NOTE — Patient Instructions (Addendum)
I'd like to get ultrasound of right leg behind knee to check for baker's cyst. Continue to follow with Dr. Roswell Miners Refilled hydrocodone today - fill after Monday. Flu shot today Return in next few months for fasting blood work and afterwards for physical. Start blood pressure medicine today amlodipine 5mg .

## 2013-08-17 NOTE — Assessment & Plan Note (Signed)
BP Readings from Last 3 Encounters:  08/16/13 150/96  12/24/12 140/100  09/25/12 140/90  bp remaining elevated every time he has come in.  Possibly pain related, but given chronicity of elevation I suggested he start amlodipine 5mg  daily - and will reassess at physical.

## 2013-08-17 NOTE — Assessment & Plan Note (Signed)
Encouraged keep appt with ortho for further evaluation/treatment plan. Continue pain regimen - vicodin and ibuprofen and voltaren. Given marked swelling and pain noted today at popliteal area, I will obtain R knee ultrasound to further evaluate for baker's cyst as cause of some of his pain. Pt agrees with plan.

## 2013-08-19 MED ORDER — AMLODIPINE BESYLATE 5 MG PO TABS: 5.0000 mg | ORAL_TABLET | Freq: Every day | ORAL | Status: DC

## 2013-08-19 NOTE — Addendum Note (Signed)
Addended by: Eustaquio Boyden on: 08/19/2013 04:25 PM   Modules accepted: Orders

## 2013-08-22 ENCOUNTER — Ambulatory Visit (HOSPITAL_COMMUNITY)
Admission: RE | Admit: 2013-08-22 | Discharge: 2013-08-22 | Disposition: A | Discharge status: Home / Self Care | Payer: 59 | Source: Ambulatory Visit | Attending: Family Medicine | Admitting: Family Medicine

## 2013-08-22 DIAGNOSIS — M25561 Pain in right knee: Secondary | ICD-10-CM

## 2013-08-22 DIAGNOSIS — M25569 Pain in unspecified knee: Secondary | ICD-10-CM | POA: Insufficient documentation

## 2013-08-29 ENCOUNTER — Other Ambulatory Visit: Payer: Self-pay | Admitting: Family Medicine

## 2013-08-29 MED ORDER — HYDROCHLOROTHIAZIDE 12.5 MG PO CAPS: 12.5000 mg | ORAL_CAPSULE | Freq: Every day | ORAL | Status: DC

## 2013-09-10 ENCOUNTER — Encounter: Payer: Self-pay | Admitting: Family Medicine

## 2013-09-16 ENCOUNTER — Other Ambulatory Visit: Payer: Self-pay | Admitting: Family Medicine

## 2013-09-16 DIAGNOSIS — E785 Hyperlipidemia, unspecified: Secondary | ICD-10-CM

## 2013-09-16 DIAGNOSIS — I10 Essential (primary) hypertension: Secondary | ICD-10-CM

## 2013-09-16 NOTE — Telephone Encounter (Signed)
Pt left vm requesting cb from Kim to f/u with her about his meeting with Dr. Traci Sermon.

## 2013-09-17 NOTE — Telephone Encounter (Signed)
Dr. Berton Lan believes he has a pinched nerve causing knee pain. He also thinks he needs a podiatrist due to flat feet. Dr. Berton Lan is scheduling MRI to r/o pinched out nerve. He will have him refer him to podiatry. Patient also asks to go back to the amlodipine because the HCTZ is making him urinate too much and have urgency and he is on the road too much for those symptoms. He said that he would rather feel groggy than have to urinate so frequently. He also asks for refill of pain med due to he will run out over the holiday.

## 2013-09-18 DIAGNOSIS — E785 Hyperlipidemia, unspecified: Secondary | ICD-10-CM | POA: Insufficient documentation

## 2013-09-18 MED ORDER — HYDROCODONE-ACETAMINOPHEN 10-325 MG PO TABS: ORAL_TABLET | ORAL | Status: DC

## 2013-09-18 MED ORDER — LISINOPRIL 10 MG PO TABS: 10.0000 mg | ORAL_TABLET | Freq: Every day | ORAL | Status: DC

## 2013-09-18 NOTE — Telephone Encounter (Signed)
Recommend we try lisinopril 10mg  daily for blood pressure - but pt will need to come in 10 d after starting to check blood work - I'd like him to be fasting for this. Hydrocodone script printed and placed in kim's box.

## 2013-09-18 NOTE — Telephone Encounter (Signed)
Message left for patient to return my call.  

## 2013-09-18 NOTE — Telephone Encounter (Signed)
Patient notified and lab appt scheduled. Rx placed up front for pick up. Advised to bring ID.

## 2013-09-30 ENCOUNTER — Other Ambulatory Visit: Payer: 59

## 2013-10-02 ENCOUNTER — Other Ambulatory Visit (INDEPENDENT_AMBULATORY_CARE_PROVIDER_SITE_OTHER): Payer: 59

## 2013-10-02 DIAGNOSIS — I10 Essential (primary) hypertension: Secondary | ICD-10-CM

## 2013-10-02 DIAGNOSIS — E785 Hyperlipidemia, unspecified: Secondary | ICD-10-CM

## 2013-10-02 LAB — LIPID PANEL
HDL: 28.5 mg/dL — ABNORMAL LOW (ref >39.00)
Total CHOL/HDL Ratio: 7
Triglycerides: 317 mg/dL — ABNORMAL HIGH (ref 0.0–149.0)

## 2013-10-02 LAB — BASIC METABOLIC PANEL
BUN: 13 mg/dL (ref 6–23)
CO2: 28 mEq/L (ref 19–32)
Chloride: 104 mEq/L (ref 96–112)
GFR: 70.33 mL/min (ref >60.00)
Glucose, Bld: 94 mg/dL (ref 70–99)
Potassium: 4.2 mEq/L (ref 3.5–5.1)
Sodium: 140 mEq/L (ref 135–145)

## 2013-10-04 ENCOUNTER — Encounter: Payer: Self-pay | Admitting: *Deleted

## 2013-10-21 ENCOUNTER — Other Ambulatory Visit: Payer: Self-pay

## 2013-10-21 MED ORDER — HYDROCODONE-ACETAMINOPHEN 10-325 MG PO TABS: ORAL_TABLET | ORAL | Status: DC

## 2013-10-21 NOTE — Telephone Encounter (Signed)
Printed and placed in Kim's box. 

## 2013-10-21 NOTE — Telephone Encounter (Signed)
Pt left v/m requesting rx hydrocodone apap. Call when ready for pick up. Pt also requested refill lisinopril;spoke with pts wife and lisinopril should have refills at [facility] [address]. Mrs Hitchens voiced understanding.

## 2013-10-22 NOTE — Telephone Encounter (Signed)
Left message on voicemail letting pt know Rx is ready for pick up

## 2014-02-13 ENCOUNTER — Other Ambulatory Visit: Payer: Self-pay

## 2014-02-13 DIAGNOSIS — I10 Essential (primary) hypertension: Secondary | ICD-10-CM

## 2014-02-13 MED ORDER — LISINOPRIL 10 MG PO TABS: 10.0000 mg | ORAL_TABLET | Freq: Every day | ORAL | Status: DC

## 2014-02-13 NOTE — Telephone Encounter (Signed)
Mrs Francisco Mullen left v/m requesting refill lisnopril to [facility] [address]. Pt was seen 08/16/14 and advised to schedule CPX; pt had elevated BP. Note indicated amlodipine 5 mg was started. Pt is presently out of lisinopril. Mrs Francisco Mullen request cb.

## 2014-02-13 NOTE — Telephone Encounter (Signed)
Needs to schedule CPE.  30 sent.

## 2014-02-14 NOTE — Telephone Encounter (Signed)
Patient advised. He will call in to schedule after looking at his work schedule.

## 2014-03-18 ENCOUNTER — Other Ambulatory Visit: Payer: Self-pay

## 2014-03-18 DIAGNOSIS — I10 Essential (primary) hypertension: Secondary | ICD-10-CM

## 2014-03-18 MED ORDER — LISINOPRIL 10 MG PO TABS: 10.0000 mg | ORAL_TABLET | Freq: Every day | ORAL | Status: DC

## 2014-03-18 NOTE — Telephone Encounter (Signed)
Francisco Mullen request refill for lisinopril to [facility] [address]. Pt already scheduled for CPX on 05/15/14. Advise refill done.

## 2014-05-15 ENCOUNTER — Encounter: Payer: Self-pay | Admitting: Family Medicine

## 2014-05-15 ENCOUNTER — Ambulatory Visit (INDEPENDENT_AMBULATORY_CARE_PROVIDER_SITE_OTHER): Payer: 59 | Admitting: Family Medicine

## 2014-05-15 VITALS — BP 128/84 | HR 104 | Temp 98.0°F | Ht 70.0 in | Wt 225.5 lb

## 2014-05-15 DIAGNOSIS — F411 Generalized anxiety disorder: Secondary | ICD-10-CM | POA: Insufficient documentation

## 2014-05-15 DIAGNOSIS — G4700 Insomnia, unspecified: Secondary | ICD-10-CM

## 2014-05-15 DIAGNOSIS — M25562 Pain in left knee: Secondary | ICD-10-CM

## 2014-05-15 DIAGNOSIS — Z0001 Encounter for general adult medical examination with abnormal findings: Secondary | ICD-10-CM | POA: Insufficient documentation

## 2014-05-15 DIAGNOSIS — Z0000 Encounter for general adult medical examination without abnormal findings: Secondary | ICD-10-CM | POA: Insufficient documentation

## 2014-05-15 DIAGNOSIS — M25561 Pain in right knee: Secondary | ICD-10-CM

## 2014-05-15 DIAGNOSIS — E669 Obesity, unspecified: Secondary | ICD-10-CM

## 2014-05-15 DIAGNOSIS — F4323 Adjustment disorder with mixed anxiety and depressed mood: Secondary | ICD-10-CM | POA: Insufficient documentation

## 2014-05-15 DIAGNOSIS — M25569 Pain in unspecified knee: Secondary | ICD-10-CM

## 2014-05-15 DIAGNOSIS — E785 Hyperlipidemia, unspecified: Secondary | ICD-10-CM

## 2014-05-15 DIAGNOSIS — I10 Essential (primary) hypertension: Secondary | ICD-10-CM

## 2014-05-15 DIAGNOSIS — F419 Anxiety disorder, unspecified: Secondary | ICD-10-CM | POA: Insufficient documentation

## 2014-05-15 MED ORDER — CITALOPRAM HYDROBROMIDE 10 MG PO TABS: 10.0000 mg | ORAL_TABLET | Freq: Every day | ORAL | Status: DC

## 2014-05-15 MED ORDER — IBUPROFEN 800 MG PO TABS: 800.0000 mg | ORAL_TABLET | Freq: Three times a day (TID) | ORAL | Status: DC | PRN

## 2014-05-15 MED ORDER — LORAZEPAM 1 MG PO TABS: 0.5000 mg | ORAL_TABLET | Freq: Two times a day (BID) | ORAL | Status: DC | PRN

## 2014-05-15 NOTE — Assessment & Plan Note (Signed)
Chronic, stable. Continue lisinopril.  

## 2014-05-15 NOTE — Assessment & Plan Note (Signed)
Support provided. Reviewed causes of GAD and anxiety attacks. rec counseling, # for our counselors provided and patient will discuss with pastor for other recommendations. rec start celexa 10mg  daily (discussed common side effects as well as need to notify us immediately if any SI). rec start ativan 1mg  1/2-1 tab prn anxiety attacks (discussed benzo risks including dependence, tolerance, abuse, addiction potential) rec temporary course. Discussed improtance of healthy relaxation techniques RTC 8 wks for f/u.

## 2014-05-15 NOTE — Assessment & Plan Note (Signed)
Preventative protocols reviewed and updated unless pt declined. Discussed healthy diet and lifestyle.  

## 2014-05-15 NOTE — Assessment & Plan Note (Signed)
FLP today. Discussed goal LDL <100 given fmhx.

## 2014-05-15 NOTE — Assessment & Plan Note (Signed)
Treated with tylenol PM prn. Trazodone too strong.

## 2014-05-15 NOTE — Progress Notes (Signed)
Pre visit review using our clinic review tool, if applicable. No additional management support is needed unless otherwise documented below in the visit note. 

## 2014-05-15 NOTE — Assessment & Plan Note (Signed)
Ibuprofen refilled. Unable to tolerate MRI. Consider SM referral for US if worsening knee pain. 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

## 2014-05-15 NOTE — Patient Instructions (Addendum)
For anxiety - start celexa 10mg  daily. May use 1/2 - 1 tablet ativan twice daily as needed for anxiety attack. Set up counselor session - # provided for our counselors but whoever you feel comfortable with. Blood work today. Return to see me in 1-2 months for follow up. Good to see you today, call us with questions.

## 2014-05-15 NOTE — Progress Notes (Signed)
BP 128/84  Pulse 104  Temp(Src) 98 F (36.7 C) (Oral)  Ht 5\' 10"  (1.778 m)  Wt 225 lb 8 oz (102.286 kg)  BMI 32.36 kg/m2   CC: CPE  Subjective:    Patient ID: Francisco Mullen, male    DOB: 13-Mar-1971, 43 y.o.   MRN: [id number]  HPI: Francisco Mullen is a 43 y.o. male presenting on 05/15/2014 for Annual Exam   Chronic knee pain - unable to tolerate MRI.   Severe anxiety - ongoing for years. Chronic sense of impending doom. Occasionally feels depressed 2/2 this. Getting worse. Self treated with St John's Wort for last 1 week. Endorses anxiety attacks associated with this. Describes confusion, chest pressure, dyspnea, sweating, abd pain, "feel like I'm dying" feels overwhelmed. Improves with stepping outside or taking a break. Having 1 attack every few days. No panic attacks per se. Does not like to leave home 2/2 this. Possible social phobia component but more anxiety attack related.    Insomnia - Tylenol PM helps. Trazodone was too strong.   Preventative: No recent CPE Flu 07/2013 Tdap 10/2010 Seat belt use discussed Sunscreen use discussed. No suspicious moles on skin  Caffeine: diet drinks --> transitioning to water Lives with wife, Marianne SofiaJagger, Chase, and daughter, 1 dog Occupation: [employer] - works from home. Edu: college Activity: no regular exercise Diet: diet drinks, fruits/vegetables daily   Relevant past medical, surgical, family and social history reviewed and updated as indicated.  Allergies and medications reviewed and updated. Current Outpatient Prescriptions on File Prior to Visit  Medication Sig  . acetaminophen (TYLENOL) 500 MG tablet Take 1,000 mg by mouth every 6 (six) hours as needed.  . Ascorbic Acid (VITAMIN C) 1000 MG tablet Take 1,000 mg by mouth daily.  Marland Kitchen. b complex vitamins tablet Take 1 tablet by mouth daily.  Marland Kitchen. EPINEPHrine (EPI-PEN) 0.3 mg/0.3 mL DEVI Inject 0.3 mLs (0.3 mg total) into the muscle once.  Marland Kitchen. lisinopril (PRINIVIL,ZESTRIL) 10 MG tablet  Take 1 tablet (10 mg total) by mouth daily.  . Multiple Vitamin (MULTIVITAMIN) tablet Take 1 tablet by mouth daily.   No current facility-administered medications on file prior to visit.    Review of Systems  Constitutional: Negative for fever, chills, activity change, appetite change, fatigue and unexpected weight change.  HENT: Negative for hearing loss.   Eyes: Negative for visual disturbance.  Respiratory: Negative for cough, chest tightness, shortness of breath and wheezing.   Cardiovascular: Negative for chest pain, palpitations and leg swelling.  Gastrointestinal: Negative for nausea, vomiting, abdominal pain, diarrhea, constipation, blood in stool and abdominal distention.  Genitourinary: Negative for hematuria and difficulty urinating.  Musculoskeletal: Negative for arthralgias, myalgias and neck pain.  Skin: Negative for rash.  Neurological: Negative for dizziness, seizures, syncope and headaches.  Hematological: Negative for adenopathy. Does not bruise/bleed easily.  Psychiatric/Behavioral: Negative for dysphoric mood. The patient is nervous/anxious.    Per HPI unless specifically indicated above    Objective:    BP 128/84  Pulse 104  Temp(Src) 98 F (36.7 C) (Oral)  Ht 5\' 10"  (1.778 m)  Wt 225 lb 8 oz (102.286 kg)  BMI 32.36 kg/m2  Physical Exam  Nursing note and vitals reviewed. Constitutional: He is oriented to person, place, and time. He appears well-developed and well-nourished. No distress.  HENT:  Head: Normocephalic and atraumatic.  Right Ear: Hearing normal.  Left Ear: Hearing normal.  Nose: Nose normal.  Mouth/Throat: Uvula is midline, oropharynx is clear and moist and  mucous membranes are normal. No oropharyngeal exudate, posterior oropharyngeal edema or posterior oropharyngeal erythema.  Eyes: Conjunctivae and EOM are normal. Pupils are equal, round, and reactive to light. No scleral icterus.  Neck: Normal range of motion. Neck supple. No thyromegaly  present.  Cardiovascular: Normal rate, regular rhythm, normal heart sounds and intact distal pulses.   No murmur heard. Pulses:      Radial pulses are 2+ on the right side, and 2+ on the left side.  Pulmonary/Chest: Effort normal and breath sounds normal. No respiratory distress. He has no wheezes. He has no rales.  Abdominal: Soft. Bowel sounds are normal. He exhibits no distension and no mass. There is no tenderness. There is no rebound and no guarding.  Musculoskeletal: Normal range of motion. He exhibits no edema.  Lymphadenopathy:    He has no cervical adenopathy.  Neurological: He is alert and oriented to person, place, and time.  CN grossly intact, station and gait intact  Skin: Skin is warm and dry. No rash noted.  Psychiatric: He has a normal mood and affect. His behavior is normal. Judgment and thought content normal.       Assessment & Plan:   Problem List Items Addressed This Visit   Obesity   Insomnia     Treated with tylenol PM prn. Trazodone too strong.    Hypertension - Primary     Chronic, stable. Continue lisinopril.    Relevant Orders      Basic metabolic panel      TSH   HLD (hyperlipidemia)     FLP today. Discussed goal LDL <100 given fmhx.    Relevant Orders      Lipid panel   Health maintenance examination     Preventative protocols reviewed and updated unless pt declined. Discussed healthy diet and lifestyle.    Bilateral knee pain     Ibuprofen refilled. Unable to tolerate MRI. Consider SM referral for [place] if worsening knee pain. 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    Relevant Orders      Uric acid   Anxiety state, unspecified     Support provided. Reviewed causes of GAD and anxiety attacks. rec counseling, # for our counselors provided and patient will discuss with pastor for other recommendations. rec start celexa 10mg  daily (discussed common side effects as well as need to notify us immediately if any SI). rec start ativan 1mg  1/2-1 tab  prn anxiety attacks (discussed benzo risks including dependence, tolerance, abuse, addiction potential) rec temporary course. Discussed improtance of healthy relaxation techniques RTC 8 wks for f/u.    Relevant Medications      citalopram (CELEXA) tablet      LORazepam (ATIVAN) tablet   Other Relevant Orders      CBC with Differential      TSH       Follow up plan: Return in about 8 weeks (around 07/10/2014), or as needed, for follow up visit.

## 2014-05-16 ENCOUNTER — Other Ambulatory Visit: Payer: Self-pay | Admitting: Family Medicine

## 2014-05-16 LAB — LIPID PANEL
CHOLESTEROL: 183 mg/dL (ref 0–200)
HDL: 31.8 mg/dL — AB (ref >39.00)
NonHDL: 151.2
TRIGLYCERIDES: 251 mg/dL — AB (ref 0.0–149.0)
Total CHOL/HDL Ratio: 6
VLDL: 50.2 mg/dL — AB (ref 0.0–40.0)

## 2014-05-16 LAB — BASIC METABOLIC PANEL
BUN: 12 mg/dL (ref 6–23)
CALCIUM: 9.5 mg/dL (ref 8.4–10.5)
CHLORIDE: 107 meq/L (ref 96–112)
CO2: 24 mEq/L (ref 19–32)
Creatinine, Ser: 0.9 mg/dL (ref 0.4–1.5)
GFR: 92.07 mL/min (ref >60.00)
Glucose, Bld: 87 mg/dL (ref 70–99)
Potassium: 4 mEq/L (ref 3.5–5.1)
Sodium: 137 mEq/L (ref 135–145)

## 2014-05-16 LAB — CBC WITH DIFFERENTIAL/PLATELET
BASOS PCT: 0.4 % (ref 0.0–3.0)
Basophils Absolute: 0 10*3/uL (ref 0.0–0.1)
Eosinophils Absolute: 0.1 10*3/uL (ref 0.0–0.7)
Eosinophils Relative: 1.3 % (ref 0.0–5.0)
HCT: 49.2 % (ref 39.0–52.0)
Hemoglobin: 16.9 g/dL (ref 13.0–17.0)
LYMPHS PCT: 24 % (ref 12.0–46.0)
Lymphs Abs: 1.9 10*3/uL (ref 0.7–4.0)
MCHC: 34.3 g/dL (ref 30.0–36.0)
MCV: 87.6 fl (ref 78.0–100.0)
MONOS PCT: 7.5 % (ref 3.0–12.0)
Monocytes Absolute: 0.6 10*3/uL (ref 0.1–1.0)
NEUTROS PCT: 66.8 % (ref 43.0–77.0)
Neutro Abs: 5.4 10*3/uL (ref 1.4–7.7)
Platelets: 248 10*3/uL (ref 150.0–400.0)
RBC: 5.61 Mil/uL (ref 4.22–5.81)
RDW: 13.4 % (ref 11.5–15.5)
WBC: 8.1 10*3/uL (ref 4.0–10.5)

## 2014-05-16 LAB — TSH: TSH: 0.79 u[IU]/mL (ref 0.35–4.50)

## 2014-05-16 LAB — LDL CHOLESTEROL, DIRECT: LDL DIRECT: 127.7 mg/dL

## 2014-05-16 LAB — URIC ACID: URIC ACID, SERUM: 7.1 mg/dL (ref 4.0–7.8)

## 2014-05-19 ENCOUNTER — Encounter: Payer: Self-pay | Admitting: *Deleted

## 2014-10-01 ENCOUNTER — Other Ambulatory Visit: Payer: Self-pay | Admitting: Family Medicine

## 2014-10-01 NOTE — Telephone Encounter (Signed)
Ok to refill 

## 2014-10-21 ENCOUNTER — Encounter: Payer: Self-pay | Admitting: Family Medicine

## 2014-10-21 ENCOUNTER — Ambulatory Visit (INDEPENDENT_AMBULATORY_CARE_PROVIDER_SITE_OTHER): Payer: 59 | Admitting: Family Medicine

## 2014-10-21 ENCOUNTER — Encounter: Payer: Self-pay | Admitting: *Deleted

## 2014-10-21 VITALS — BP 112/90 | HR 64 | Temp 98.0°F | Wt 230.5 lb

## 2014-10-21 DIAGNOSIS — E669 Obesity, unspecified: Secondary | ICD-10-CM

## 2014-10-21 DIAGNOSIS — M25561 Pain in right knee: Secondary | ICD-10-CM

## 2014-10-21 DIAGNOSIS — I10 Essential (primary) hypertension: Secondary | ICD-10-CM

## 2014-10-21 DIAGNOSIS — Z23 Encounter for immunization: Secondary | ICD-10-CM

## 2014-10-21 DIAGNOSIS — IMO0002 Reserved for concepts with insufficient information to code with codable children: Secondary | ICD-10-CM

## 2014-10-21 DIAGNOSIS — M25562 Pain in left knee: Secondary | ICD-10-CM

## 2014-10-21 DIAGNOSIS — F411 Generalized anxiety disorder: Secondary | ICD-10-CM

## 2014-10-21 MED ORDER — CITALOPRAM HYDROBROMIDE 20 MG PO TABS: ORAL_TABLET | ORAL | Status: DC

## 2014-10-21 MED ORDER — LISINOPRIL 10 MG PO TABS: ORAL_TABLET | ORAL | Status: DC

## 2014-10-21 MED ORDER — TYPHOID VACCINE PO CPDR: 1.0000 | DELAYED_RELEASE_CAPSULE | ORAL | Status: DC

## 2014-10-21 MED ORDER — LORAZEPAM 1 MG PO TABS: 0.5000 mg | ORAL_TABLET | Freq: Two times a day (BID) | ORAL | Status: DC | PRN

## 2014-10-21 NOTE — Patient Instructions (Addendum)
Flu shot today. Start hep A/B series today. meds refilled today - increase celexa to 20mg  daily. Continue lisinopril and ativan as needed. Urine check today.

## 2014-10-21 NOTE — Assessment & Plan Note (Signed)
Continues ibuprofen. Decreased sxs with less active job.

## 2014-10-21 NOTE — Assessment & Plan Note (Signed)
Reviewed CDC recommendations with patient- will provide first of Hep A/B series and provided with script for vivotif with discussion on side effects and how to administer.

## 2014-10-21 NOTE — Progress Notes (Signed)
BP 112/90 mmHg  Pulse 64  Temp(Src) 98 F (36.7 C) (Oral)  Wt 230 lb 8 oz (104.554 kg)   CC: 6 mo f/u visit  Subjective:    Patient ID: Francisco Mullen, male    DOB: 1971-04-24, 43 y.o.   MRN: [id number]  HPI: Francisco Mullen is a 43 y.o. male presenting on 10/21/2014 for Follow-up   HTN - Compliant with current antihypertensive regimen of lisinopril 10mg  daily.  Does not check blood pressures at home.  No low blood pressure readings or symptoms of dizziness/syncope.  Denies HA, vision changes, CP/tightness, SOB, leg swelling.   Anxiety - last visit celexa/ativan started. Requests refill today. Persistent anxiety attacks. Related to noise and stressful atmosphere. celexa works well with ativan PRN. Several attacks per week.   Knee pain, bilateral R>L, actually thought minimal arthritis, rather meniscal injury MRI ordered (Dr Despina HickAlusio). Unable to tolerate MRI 2/2 knee pain. On ibuprofen 800mg  prn and tylenol PM. Actually feeling better because of decreased physical demands from work. On motrin prn a few times a week.   Upcoming trip to [place] 01/2015 asks about needed immunizations.  Relevant past medical, surgical, family and social history reviewed and updated as indicated. Interim medical history since our last visit reviewed. Allergies and medications reviewed and updated.  Current Outpatient Prescriptions on File Prior to Visit  Medication Sig  . acetaminophen (TYLENOL) 500 MG tablet Take 1,000 mg by mouth every 6 (six) hours as needed.  . Ascorbic Acid (VITAMIN C) 1000 MG tablet Take 1,000 mg by mouth daily.  Marland Kitchen. b complex vitamins tablet Take 1 tablet by mouth daily.  . diphenhydramine-acetaminophen (TYLENOL PM) 25-500 MG TABS Take 1 tablet by mouth at bedtime as needed.  Marland Kitchen. ibuprofen (ADVIL,MOTRIN) 800 MG tablet TAKE ONE TABLET BY MOUTH EVERY 8 HOURS AS NEEDED FOR MODERATE  PAIN  . Multiple Vitamin (MULTIVITAMIN) tablet Take 1 tablet by mouth daily.  Marland Kitchen. EPINEPHrine (EPI-PEN) 0.3  mg/0.3 mL DEVI Inject 0.3 mLs (0.3 mg total) into the muscle once. (Patient not taking: Reported on 10/21/2014)   No current facility-administered medications on file prior to visit.    Review of Systems Per HPI unless specifically indicated above     Objective:    BP 112/90 mmHg  Pulse 64  Temp(Src) 98 F (36.7 C) (Oral)  Wt 230 lb 8 oz (104.554 kg)  Wt Readings from Last 3 Encounters:  10/21/14 230 lb 8 oz (104.554 kg)  05/15/14 225 lb 8 oz (102.286 kg)  08/16/13 225 lb 8 oz (102.286 kg)    Physical Exam  Constitutional: He appears well-developed and well-nourished. No distress.  Psychiatric: He has a normal mood and affect.  Nursing note and vitals reviewed.      Assessment & Plan:   Problem List Items Addressed This Visit    Obesity    Pt aware weight loss will help knee pain    Hypertension    Chronic, stable. Continue lisinopril.    Relevant Medications      lisinopril (PRINIVIL,ZESTRIL) tablet   GAD (generalized anxiety disorder) - Primary    Improvement noted with celexa/ativan. Rare ativan use. Will continue current regimen, increase celexa to 20mg  daily. Pt agrees with plan.    Relevant Medications      LORazepam (ATIVAN) tablet   Bilateral knee pain    Continues ibuprofen. Decreased sxs with less active job.    Advice or immunization for travel    Reviewed CDC recommendations with patient- will  provide first of Hep A/B series and provided with script for vivotif with discussion on side effects and how to administer.     Other Visit Diagnoses    Need for influenza vaccination        Relevant Orders       Flu Vaccine QUAD 36+ mos PF IM (Fluarix Quad PF)        Follow up plan: Return in about 7 months (around 05/22/2015), or as needed, for annual exam, prior fasting for blood work.

## 2014-10-21 NOTE — Assessment & Plan Note (Signed)
Chronic, stable. Continue lisinopril.  

## 2014-10-21 NOTE — Assessment & Plan Note (Signed)
Improvement noted with celexa/ativan. Rare ativan use. Will continue current regimen, increase celexa to 20mg  daily. Pt agrees with plan.

## 2014-10-21 NOTE — Assessment & Plan Note (Signed)
Pt aware weight loss will help knee pain

## 2014-10-21 NOTE — Progress Notes (Signed)
Pre visit review using our clinic review tool, if applicable. No additional management support is needed unless otherwise documented below in the visit note. 

## 2014-11-05 ENCOUNTER — Encounter: Payer: Self-pay | Admitting: Family Medicine

## 2014-11-25 ENCOUNTER — Ambulatory Visit: Payer: 59

## 2015-04-06 ENCOUNTER — Encounter: Payer: Self-pay | Admitting: *Deleted

## 2015-04-06 ENCOUNTER — Ambulatory Visit (INDEPENDENT_AMBULATORY_CARE_PROVIDER_SITE_OTHER): Payer: 59 | Admitting: Family Medicine

## 2015-04-06 ENCOUNTER — Encounter: Payer: Self-pay | Admitting: Family Medicine

## 2015-04-06 VITALS — BP 118/86 | HR 92 | Temp 97.9°F | Wt 235.8 lb

## 2015-04-06 DIAGNOSIS — M79671 Pain in right foot: Secondary | ICD-10-CM | POA: Diagnosis not present

## 2015-04-06 DIAGNOSIS — F411 Generalized anxiety disorder: Secondary | ICD-10-CM | POA: Diagnosis not present

## 2015-04-06 DIAGNOSIS — M25561 Pain in right knee: Secondary | ICD-10-CM

## 2015-04-06 DIAGNOSIS — M79672 Pain in left foot: Secondary | ICD-10-CM

## 2015-04-06 DIAGNOSIS — M25562 Pain in left knee: Secondary | ICD-10-CM

## 2015-04-06 MED ORDER — DICLOFENAC SODIUM 75 MG PO TBEC: DELAYED_RELEASE_TABLET | ORAL | Status: DC

## 2015-04-06 MED ORDER — LORAZEPAM 1 MG PO TABS: 0.5000 mg | ORAL_TABLET | Freq: Two times a day (BID) | ORAL | Status: DC | PRN

## 2015-04-06 NOTE — Patient Instructions (Signed)
I think this is from poor foot mechanics including flat foot with collapse of longitudinal arch. Treat with voltaren tablet (if not helpful let me know for voltaren gel).  labwork today. Schedule appointment with Dr Patsy Lager up front to discuss custom orthotics.

## 2015-04-06 NOTE — Assessment & Plan Note (Signed)
Refilled ativan for upcoming airplane trip. Continue celexa - working well.

## 2015-04-06 NOTE — Assessment & Plan Note (Signed)
S/p eval by ortho - anticipate poor foot mechanics contributing. See below.

## 2015-04-06 NOTE — Progress Notes (Signed)
Pre visit review using our clinic review tool, if applicable. No additional management support is needed unless otherwise documented below in the visit note. 

## 2015-04-06 NOTE — Progress Notes (Signed)
BP 118/86 mmHg  Pulse 92  Temp(Src) 97.9 F (36.6 C) (Oral)  Wt 235 lb 12 oz (106.935 kg)   CC: "my feet are killing me" Subjective:    Patient ID: Francisco Mullen, male    DOB: 1971-03-14, 44 y.o.   MRN: [id number]  HPI: Francisco Mullen is a 44 y.o. male presenting on 04/06/2015 for Foot Pain   Chronic foot pain, exacerbated over last few days. He's also noticed shooting pain soles of feet as well as ankle pain, toe pain. Over last few weeks noticing more nodules popping up on feet.   Wife endorses toe joints get red and swollen and sore to touch - at times tender for sheet to touch joint.   Motrin/tylenol doesn't help foot pain.  Father with arthritis of feet.  No known history of gout.  Relevant past medical, surgical, family and social history reviewed and updated as indicated. Interim medical history since our last visit reviewed. Allergies and medications reviewed and updated. Current Outpatient Prescriptions on File Prior to Visit  Medication Sig  . acetaminophen (TYLENOL) 500 MG tablet Take 1,000 mg by mouth every 6 (six) hours as needed.  . Ascorbic Acid (VITAMIN C) 1000 MG tablet Take 1,000 mg by mouth daily.  Marland Kitchen b complex vitamins tablet Take 1 tablet by mouth daily.  . citalopram (CELEXA) 20 MG tablet Take one tablet by mouth daily  . diphenhydramine-acetaminophen (TYLENOL PM) 25-500 MG TABS Take 1 tablet by mouth at bedtime as needed.  Marland Kitchen EPINEPHrine (EPI-PEN) 0.3 mg/0.3 mL DEVI Inject 0.3 mLs (0.3 mg total) into the muscle once.  Marland Kitchen ibuprofen (ADVIL,MOTRIN) 800 MG tablet TAKE ONE TABLET BY MOUTH EVERY 8 HOURS AS NEEDED FOR MODERATE  PAIN  . lisinopril (PRINIVIL,ZESTRIL) 10 MG tablet Take one tablet daily  . Multiple Vitamin (MULTIVITAMIN) tablet Take 1 tablet by mouth daily.  . typhoid (VIVOTIF BERNA VACCINE) DR capsule Take 1 capsule by mouth every other day. Start 2 weeks prior to travel (Patient not taking: Reported on 04/06/2015)   No current  facility-administered medications on file prior to visit.    Review of Systems Per HPI unless specifically indicated above     Objective:    BP 118/86 mmHg  Pulse 92  Temp(Src) 97.9 F (36.6 C) (Oral)  Wt 235 lb 12 oz (106.935 kg)  Wt Readings from Last 3 Encounters:  04/06/15 235 lb 12 oz (106.935 kg)  10/21/14 230 lb 8 oz (104.554 kg)  05/15/14 225 lb 8 oz (102.286 kg)    Physical Exam  Constitutional: He appears well-developed and well-nourished. No distress.  Musculoskeletal: He exhibits no edema.  No palpable cords at calf. 1+ DP bilaterally No evident swelling/erythema noted today. Tender to palpation bilateral 1st MTPs and at IPJs of toes. Mild discomfort to palpation at soles Neg squeeze test, no pain at base of 5th MT, malleoli, or with calcaneal squeeze. Loss of longitudinal arch bilaterally when standing as well as splaying of toes laterally with stance.  Skin: Skin is warm and dry. No rash noted.  Psychiatric: He has a normal mood and affect.  Nursing note and vitals reviewed.     Assessment & Plan:   Problem List Items Addressed This Visit    Bilateral foot pain - Primary    Anticipate due to poor foot mechanics with evident pes planus bilaterally. Not consistent with inflammatory arthritis, ?gout. Check ESR and uric acid today. Treat with voltaren tablets twice daily, if not effective will change  to voltaren gel which has been effective previously. Will also refer him to Dr Lorelei Pont for further evaluation and consideration of custom orthotics to correct faulty mechanics. Pt agrees with plan.      Relevant Orders   Sedimentation rate   Uric acid   Bilateral knee pain    S/p eval by ortho - anticipate poor foot mechanics contributing. See below.      GAD (generalized anxiety disorder)    Refilled ativan for upcoming airplane trip. Continue celexa - working well.      Relevant Medications   LORazepam (ATIVAN) 1 MG tablet       Follow up  plan: Return if symptoms worsen or fail to improve.

## 2015-04-06 NOTE — Assessment & Plan Note (Signed)
Anticipate due to poor foot mechanics with evident pes planus bilaterally. Not consistent with inflammatory arthritis, ?gout. Check ESR and uric acid today. Treat with voltaren tablets twice daily, if not effective will change to voltaren gel which has been effective previously. Will also refer him to Dr Lorelei Pont for further evaluation and consideration of custom orthotics to correct faulty mechanics. Pt agrees with plan.

## 2015-04-07 LAB — SEDIMENTATION RATE: SED RATE: 9 mm/h (ref 0–22)

## 2015-04-07 LAB — URIC ACID: Uric Acid, Serum: 6.4 mg/dL (ref 4.0–7.8)

## 2015-04-29 ENCOUNTER — Ambulatory Visit (INDEPENDENT_AMBULATORY_CARE_PROVIDER_SITE_OTHER): Payer: 59

## 2015-04-29 ENCOUNTER — Encounter: Payer: Self-pay | Admitting: Podiatry

## 2015-04-29 ENCOUNTER — Ambulatory Visit (INDEPENDENT_AMBULATORY_CARE_PROVIDER_SITE_OTHER): Payer: 59 | Admitting: Podiatry

## 2015-04-29 ENCOUNTER — Ambulatory Visit: Payer: Self-pay | Admitting: Family Medicine

## 2015-04-29 VITALS — BP 121/83 | HR 81 | Resp 15

## 2015-04-29 DIAGNOSIS — M722 Plantar fascial fibromatosis: Secondary | ICD-10-CM

## 2015-04-29 DIAGNOSIS — M79672 Pain in left foot: Principal | ICD-10-CM

## 2015-04-29 DIAGNOSIS — M79671 Pain in right foot: Secondary | ICD-10-CM

## 2015-04-29 MED ORDER — TRIAMCINOLONE ACETONIDE 10 MG/ML IJ SUSP
10.0000 mg | Freq: Once | INTRAMUSCULAR | Status: AC
Start: 2015-04-29 — End: 2015-04-29
  Administered 2015-04-29: 10 mg

## 2015-04-29 MED ORDER — PREDNISONE 10 MG PO TABS
ORAL_TABLET | ORAL | Status: DC
Start: 2015-04-29 — End: 2015-09-01

## 2015-04-29 NOTE — Progress Notes (Signed)
Subjective:     Patient ID: Francisco Mullen, male   DOB: 04-30-1971, 44 y.o.   MRN: [id number]  HPI patient states I'm always had pain in my feet but it seems like it's getting worse and it seems like it's my heel my arch in my forefoot of both feet and the last 6 months and been awful. Patient states he can really do no significant ambulation and it seems that his pain is just becoming more severe   Review of Systems  All other systems reviewed and are negative.      Objective:   Physical Exam  Constitutional: He is oriented to person, place, and time.  Cardiovascular: Intact distal pulses.   Musculoskeletal: Normal range of motion.  Neurological: He is oriented to person, place, and time.  Skin: Skin is warm.  Nursing note and vitals reviewed.  neurovascular status was found to be intact with muscle strength adequate and range of motion mildly diminished with patient splinting upon movement. The pain seems to be the most intense in the heel region bilateral but there is also discomfort into the arch is and patient has pain in the forefoot bilateral with mild edema. On the left first metatarsal there is a small lesion where he hit his foot that is also painful around the first metatarsal head itself on the medial side.     Assessment:     Very difficult to make complete determination on this patient due to the number of areas the pain is but appears to be inflammatory whether localized or systemic with a slight possibility it could involve his back or be nerve related    Plan:     H&P and x-rays reviewed and today I'm focusing on the heels to try to reduce the most acute component of pain and injected the plantar fascia bilateral 3 mg Kenalog 5 mg Xylocaine and applied fascial brace bilateral and placed on sterile prep DS 12 day Dosepak. Reappoint in 1 week or earlier if any issues should occur

## 2015-04-29 NOTE — Progress Notes (Signed)
   Subjective:    Patient ID: Francisco JeffersonMatthew E Delamora, male    DOB: 24-Nov-1970, 44 y.o.   MRN: [id number]  HPI Pt presents with bilateral foot pain that runs up the arch, lasting one year. He has tried supportive shoes, ice, nsaids and diclofenac with no relief, c/o bilateral toe cramping. Soreness to the touch left mpj, reddened and swollen   Review of Systems  Musculoskeletal: Positive for back pain, arthralgias and gait problem.  Allergic/Immunologic: Positive for environmental allergies and food allergies.  All other systems reviewed and are negative.      Objective:   Physical Exam        Assessment & Plan:

## 2015-04-29 NOTE — Patient Instructions (Signed)

## 2015-05-06 ENCOUNTER — Encounter: Payer: Self-pay | Admitting: Podiatry

## 2015-05-06 ENCOUNTER — Ambulatory Visit (INDEPENDENT_AMBULATORY_CARE_PROVIDER_SITE_OTHER): Payer: 59 | Admitting: Podiatry

## 2015-05-06 VITALS — BP 131/97 | HR 103 | Resp 12

## 2015-05-06 DIAGNOSIS — M722 Plantar fascial fibromatosis: Secondary | ICD-10-CM

## 2015-05-06 DIAGNOSIS — M79672 Pain in left foot: Secondary | ICD-10-CM | POA: Diagnosis not present

## 2015-05-06 DIAGNOSIS — M79671 Pain in right foot: Secondary | ICD-10-CM

## 2015-05-06 MED ORDER — DICLOFENAC SODIUM 75 MG PO TBEC: 75.0000 mg | DELAYED_RELEASE_TABLET | Freq: Two times a day (BID) | ORAL | Status: DC

## 2015-05-06 NOTE — Progress Notes (Signed)
Subjective:     Patient ID: Francisco Mullen, male   DOB: [birth year], 44 y.o.   MRN: [id number]  HPI patient states that I have had some improvement in my pain and it seems that the worse of my pain is reduced. I still get quite a bit of pain in my arch and I still have that small lump on my left arch that bothers me and I have taken the medicine but it is difficult for me to tolerate   Review of Systems     Objective:   Physical Exam Neurovascular status intact muscle strength adequate with reduced arch height noted and discomfort of a moderate nature in the plantar heel bilateral with significant improvement and pain into the arch left over right with a small nodule in the distal arch left that is painful when pressed    Assessment:     Plantar fasciitis bilateral with small nodule noted left and mechanical dysfunction with possibility still for systemic inflammatory condition    Plan:     H&P condition reviewed with patient and at this time I've recommended long-term orthotics to try to control pathology. Patient is scanned for orthotics and I also dispensed night splint with instructions for heat and ice therapy and rotation with consideration for a second night splint for able to achieve good relief. He will start diclofenac when he finishes his Medrol Dosepak

## 2015-05-06 NOTE — Progress Notes (Signed)
   Subjective:    Patient ID: Francisco JeffersonMatthew E Mullen, male    DOB: [birth year], 44 y.o.   MRN: [id number]  HPI Patient has noticed the pain in BIL feet has subsided to a degree. However patient still experiences discomfort when on their feet and walking.   Review of Systems     Objective:   Physical Exam        Assessment & Plan:

## 2015-05-29 ENCOUNTER — Ambulatory Visit: Payer: 59 | Admitting: *Deleted

## 2015-05-29 ENCOUNTER — Encounter: Payer: 59 | Admitting: Family Medicine

## 2015-05-29 DIAGNOSIS — M722 Plantar fascial fibromatosis: Secondary | ICD-10-CM

## 2015-05-29 NOTE — Progress Notes (Signed)
Patient ID: Francisco Mullen, male   DOB: [birth year], 44 y.o.   MRN: [id number] Patient presents for orthotic pick up.  Verbal and written break in and wear instructions given.  Patient will follow up in 4 weeks if symptoms worsen or fail to improve.

## 2015-05-29 NOTE — Patient Instructions (Signed)

## 2015-08-26 IMAGING — US US EXTREM LOW*R* LIMITED
1 series · 9 of 9 positions shown · non-contrast
Comparison: None.

CLINICAL DATA: Chronic popliteal pain, evaluate for Baker cyst

EXAM:
RIGHT LOWER EXTREMITY SOFT TISSUE ULTRASOUND LIMITED
TECHNIQUE: Ultrasound examination was performed including evaluation of the
muscles, tendons, joint, and adjacent soft tissues.

[Series 1: us extrem low*right* limited · 0.09mm/px · 9 of 9 slices shown]
[im 1/9]
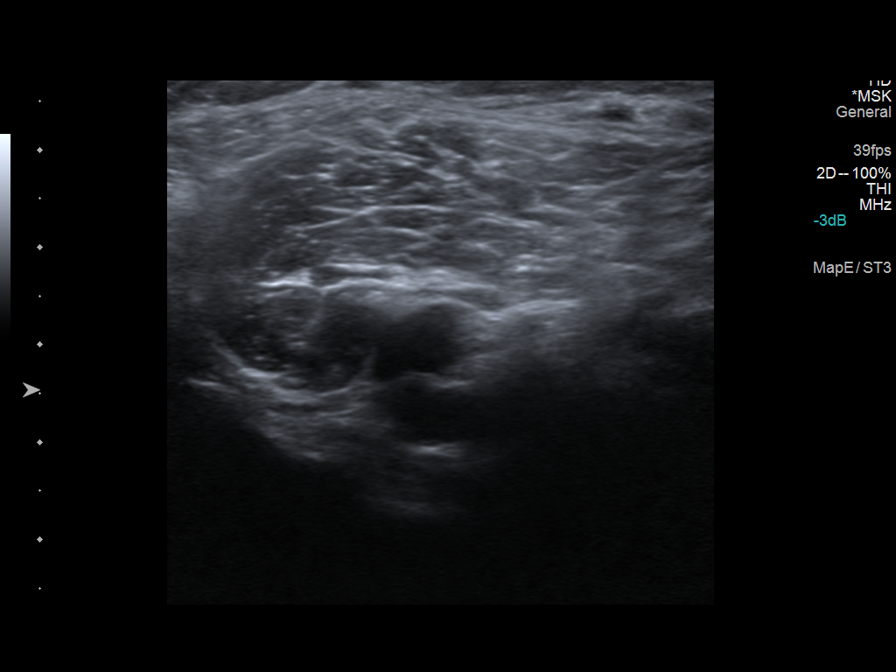
[im 2/9]
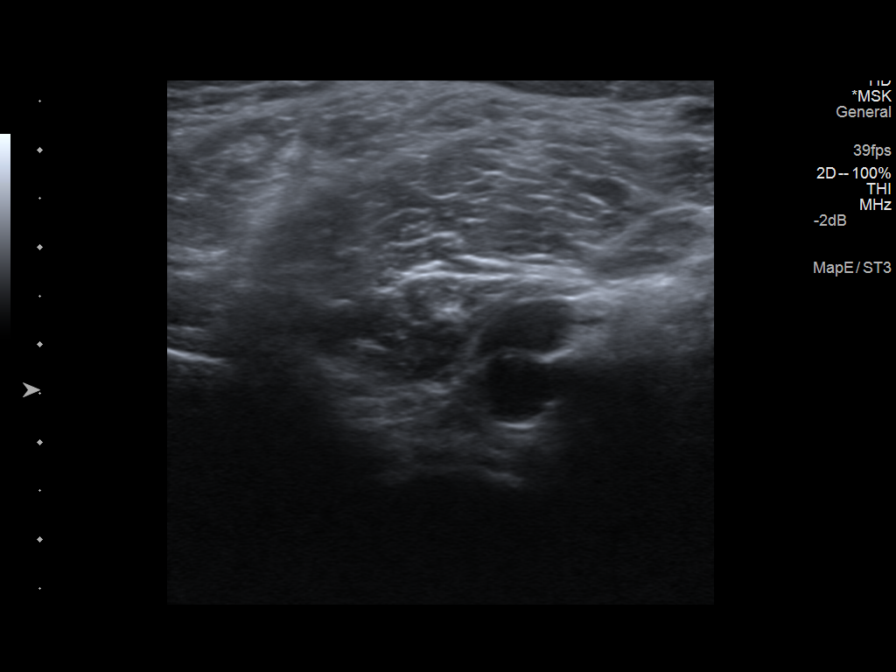
[im 3/9]
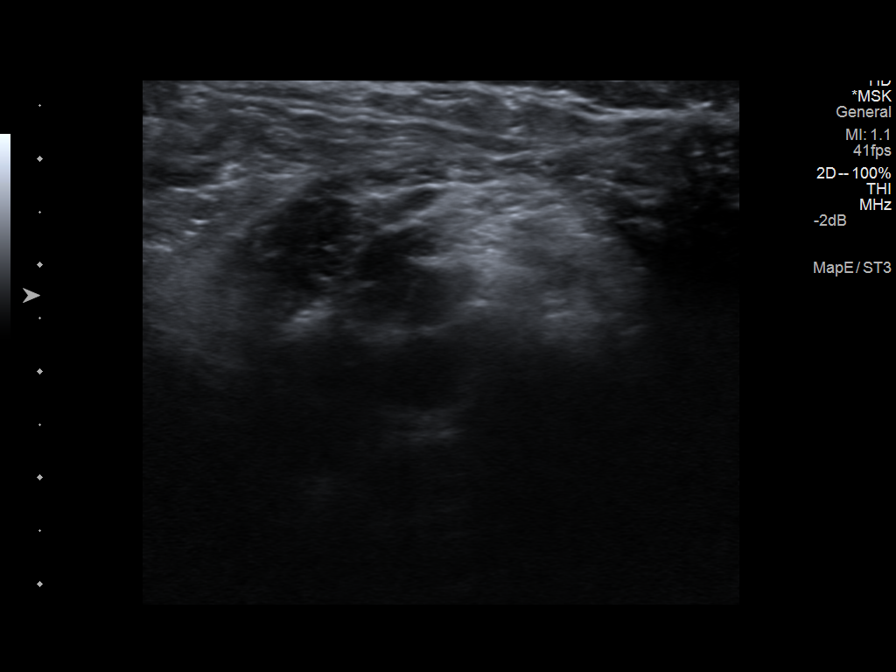
[im 4/9]
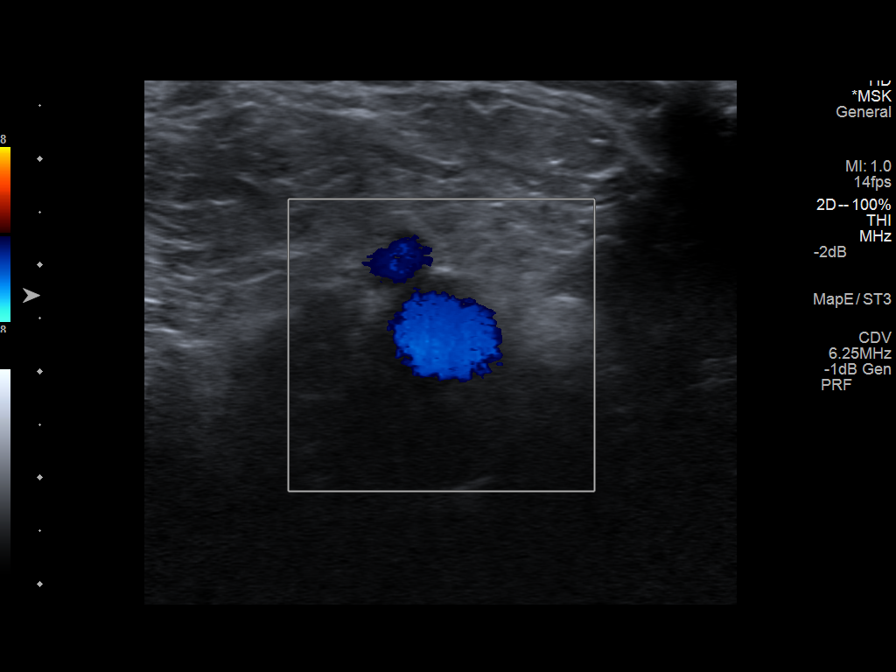
[im 5/9]
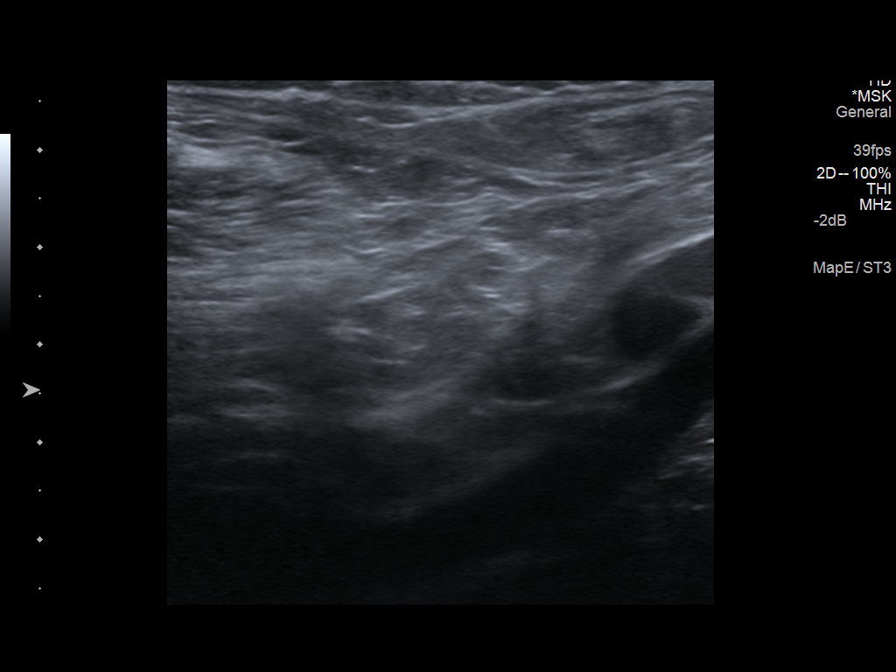
[im 6/9]
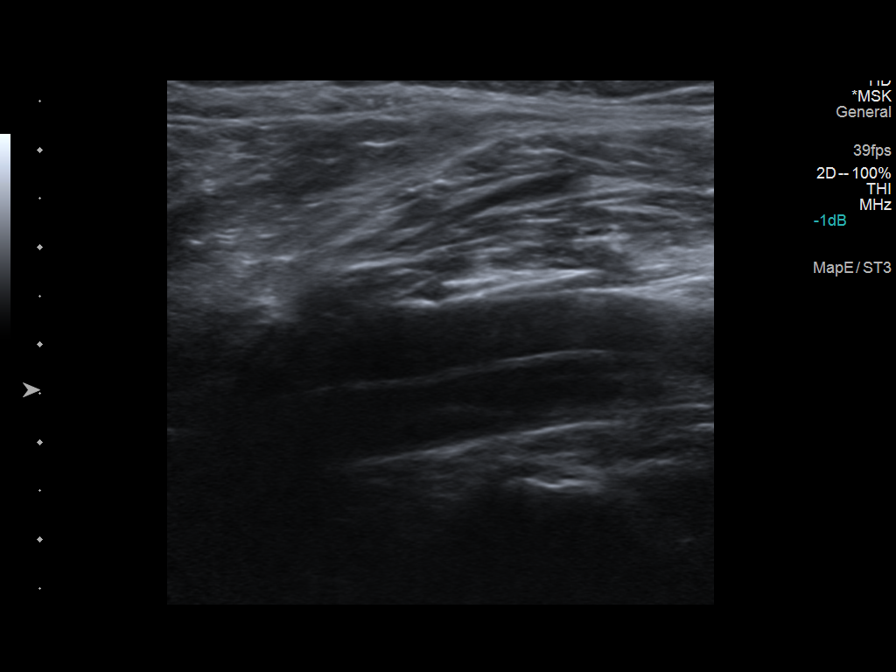
[im 7/9]
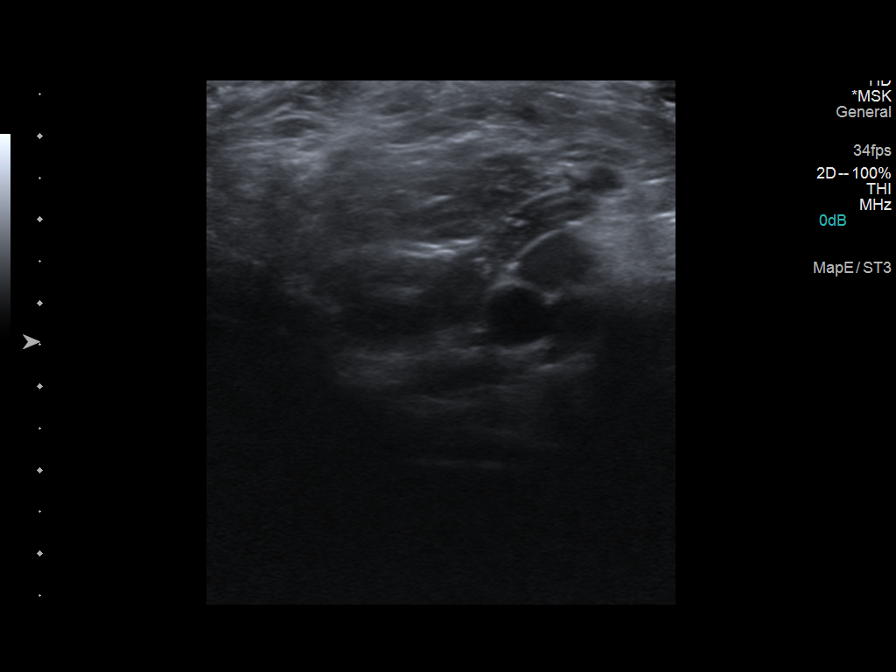
[im 8/9]
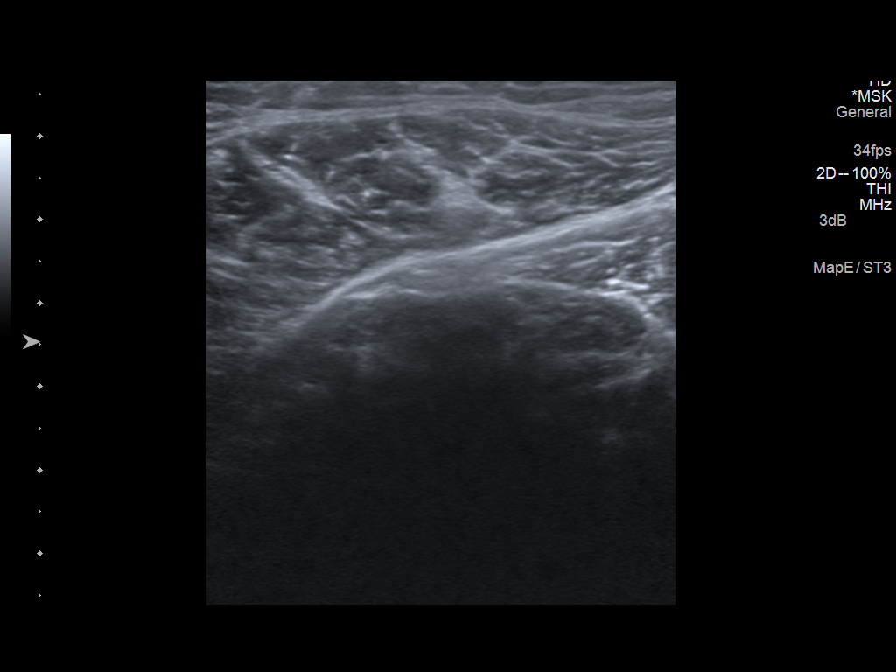
[im 9/9]
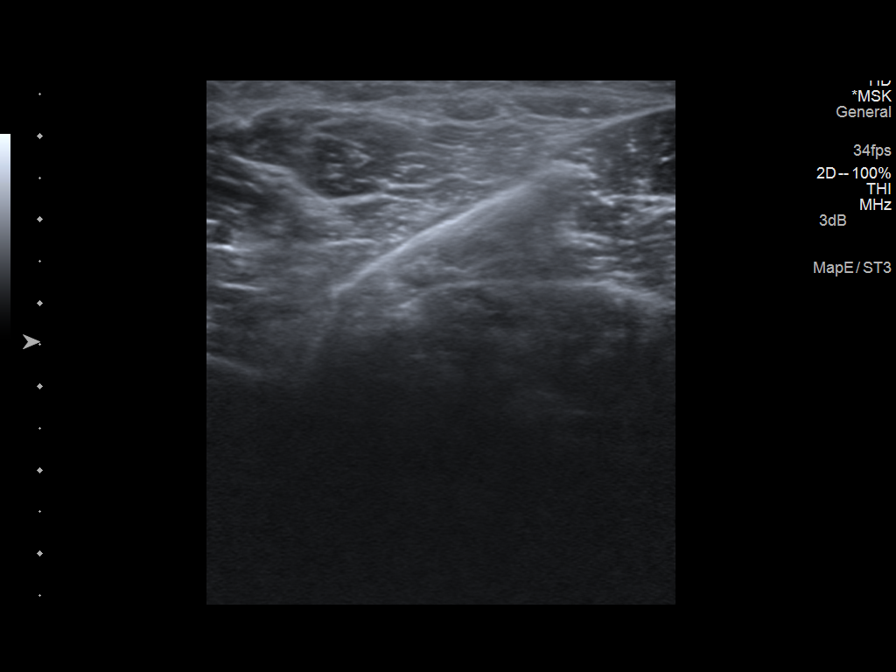

[9 of 9 positions shown; findings below may reference images not displayed]

FINDINGS: Focused ultrasound evaluation of the popliteal space and
demonstrates no focal fluid collection to suggest the presence of a
Baker's cyst. The popliteal vein and artery are grossly within
normal limits. No evidence of aneurysmal dilatation.
IMPRESSION: Negative.

## 2015-09-01 ENCOUNTER — Other Ambulatory Visit: Payer: Self-pay | Admitting: Family Medicine

## 2015-09-01 ENCOUNTER — Encounter: Payer: Self-pay | Admitting: Family Medicine

## 2015-09-01 ENCOUNTER — Encounter: Payer: Self-pay | Admitting: *Deleted

## 2015-09-01 ENCOUNTER — Ambulatory Visit (INDEPENDENT_AMBULATORY_CARE_PROVIDER_SITE_OTHER)
Admission: RE | Admit: 2015-09-01 | Discharge: 2015-09-01 | Disposition: A | Discharge status: Home / Self Care | Payer: Managed Care, Other (non HMO) | Source: Ambulatory Visit | Attending: Family Medicine | Admitting: Family Medicine

## 2015-09-01 ENCOUNTER — Ambulatory Visit (INDEPENDENT_AMBULATORY_CARE_PROVIDER_SITE_OTHER): Payer: Managed Care, Other (non HMO) | Admitting: Family Medicine

## 2015-09-01 VITALS — BP 117/76 | HR 114 | Temp 98.2°F | Wt 240.2 lb

## 2015-09-01 DIAGNOSIS — R05 Cough: Secondary | ICD-10-CM | POA: Diagnosis not present

## 2015-09-01 DIAGNOSIS — R059 Cough, unspecified: Secondary | ICD-10-CM

## 2015-09-01 DIAGNOSIS — J189 Pneumonia, unspecified organism: Secondary | ICD-10-CM

## 2015-09-01 HISTORY — DX: Pneumonia, unspecified organism: J18.9

## 2015-09-01 LAB — POCT INFLUENZA A/B
INFLUENZA B, POC: NEGATIVE
Influenza A, POC: NEGATIVE

## 2015-09-01 MED ORDER — AZITHROMYCIN 250 MG PO TABS: ORAL_TABLET | ORAL | Status: DC

## 2015-09-01 MED ORDER — CEFTRIAXONE SODIUM 1 G IJ SOLR
1.0000 g | Freq: Once | INTRAMUSCULAR | Status: AC
  Administered 2015-09-01: 1 g via INTRAMUSCULAR

## 2015-09-01 MED ORDER — GUAIFENESIN-CODEINE 100-10 MG/5ML PO SYRP: 5.0000 mL | ORAL_SOLUTION | Freq: Every evening | ORAL | Status: DC | PRN

## 2015-09-01 NOTE — Progress Notes (Signed)
BP 117/76 mmHg  Pulse 114  Temp(Src) 98.2 F (36.8 C) (Oral)  Wt 240 lb 4 oz (108.977 kg)  SpO2 97%   CC: cough, fever  Subjective:    Patient ID: Francisco Mullen Rembold, male    DOB: Feb 15, 1971, 44 y.o.   MRN: [id number]  HPI: Francisco Mullen Hobbs is a 44 y.o. male presenting on 09/01/2015 for Cough   3-4d h/o cold sxs, significant dyspnea with cough "feel smothering" that started yesterday. Tmax 103. Cough productive of brown sputum. + HA, head and chest congestion, PNDrainage, wheezing. + myalgias/arthralgias.   No ear or tooth pain, ST.  Taking dayquil, nyquil, tylenol, motrin. No improvement.  Sons x2 sick at home (one with PNA 3 wks ago). Grandfather sick as well.  No h/o asthma.  Has not received flu shot yet.   Relevant past medical, surgical, family and social history reviewed and updated as indicated. Interim medical history since our last visit reviewed. Allergies and medications reviewed and updated. Current Outpatient Prescriptions on File Prior to Visit  Medication Sig  . acetaminophen (TYLENOL) 500 MG tablet Take 1,000 mg by mouth every 6 (six) hours as needed.  . Ascorbic Acid (VITAMIN C) 1000 MG tablet Take 1,000 mg by mouth daily.  Marland Kitchen. b complex vitamins tablet Take 1 tablet by mouth daily.  . citalopram (CELEXA) 20 MG tablet Take one tablet by mouth daily  . EPINEPHrine (EPI-PEN) 0.3 mg/0.3 mL DEVI Inject 0.3 mLs (0.3 mg total) into the muscle once.  Marland Kitchen. ibuprofen (ADVIL,MOTRIN) 800 MG tablet TAKE ONE TABLET BY MOUTH EVERY 8 HOURS AS NEEDED FOR MODERATE  PAIN  . lisinopril (PRINIVIL,ZESTRIL) 10 MG tablet Take one tablet daily  . Multiple Vitamin (MULTIVITAMIN) tablet Take 1 tablet by mouth daily.  Marland Kitchen. LORazepam (ATIVAN) 1 MG tablet Take 0.5-1 tablets (0.5-1 mg total) by mouth 2 (two) times daily as needed for anxiety. (Patient not taking: Reported on 09/01/2015)   No current facility-administered medications on file prior to visit.    Review of Systems Per HPI unless  specifically indicated in ROS section     Objective:    BP 117/76 mmHg  Pulse 114  Temp(Src) 98.2 F (36.8 C) (Oral)  Wt 240 lb 4 oz (108.977 kg)  SpO2 97%  Wt Readings from Last 3 Encounters:  09/01/15 240 lb 4 oz (108.977 kg)  04/06/15 235 lb 12 oz (106.935 kg)  10/21/14 230 lb 8 oz (104.554 kg)    Physical Exam  Constitutional: He appears well-developed and well-nourished. No distress.  Fatigued, tired appearing  HENT:  Head: Normocephalic and atraumatic.  Right Ear: Hearing, tympanic membrane, external ear and ear canal normal.  Left Ear: Hearing, tympanic membrane, external ear and ear canal normal.  Nose: Mucosal edema (erythema) present. No rhinorrhea. Right sinus exhibits no maxillary sinus tenderness and no frontal sinus tenderness. Left sinus exhibits no maxillary sinus tenderness and no frontal sinus tenderness.  Mouth/Throat: Uvula is midline, oropharynx is clear and moist and mucous membranes are normal. No oropharyngeal exudate, posterior oropharyngeal edema, posterior oropharyngeal erythema or tonsillar abscesses.  Eyes: Conjunctivae and EOM are normal. Pupils are equal, round, and reactive to light. No scleral icterus.  Neck: Normal range of motion. Neck supple.  Cardiovascular: Normal rate, regular rhythm, normal heart sounds and intact distal pulses.   No murmur heard. Pulmonary/Chest: Effort normal and breath sounds normal. No respiratory distress. He has no wheezes. He has no rales.  lungs overall clear but coughing present  Lymphadenopathy:  He has no cervical adenopathy.  Skin: Skin is warm and dry. No rash noted.  Nursing note and vitals reviewed.    Assessment & Plan:   Problem List Items Addressed This Visit    CAP (community acquired pneumonia) - Primary    Cough with fever and malaise but good O2 sat. Ok for [employer] as well. Check flu swab today - negative. Given recent sick contact and severity of cough, check CXR today - possible LLL  infiltrate. Treat with rocephine 1gm IM, zpack, cheratussin. Update if not improving with treatment. Pt agrees with plan.       Relevant Medications   azithromycin (ZITHROMAX) 250 MG tablet   guaiFENesin-codeine (ROBITUSSIN AC) 100-10 MG/5ML syrup       Follow up plan: Return if symptoms worsen or fail to improve.

## 2015-09-01 NOTE — Addendum Note (Signed)
Addended by: Josph MachoANCE, Monta Maiorana A on: 09/01/2015 11:08 AM   Modules accepted: Orders

## 2015-09-01 NOTE — Progress Notes (Signed)
Pre visit review using our clinic review tool, if applicable. No additional management support is needed unless otherwise documented below in the visit note. 

## 2015-09-01 NOTE — Patient Instructions (Addendum)
I do think you have community acquired pneumonia. Treat with shot of rocephin and zpack. Use cheratussin for cough as needed at home - caution it may make you sleepy. Update us if not improving with treatment. Work note provided today.

## 2015-09-01 NOTE — Assessment & Plan Note (Signed)
Cough with fever and malaise but good O2 sat. 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 Check flu swab today - negative. Given recent sick contact and severity of cough, check CXR today - possible LLL infiltrate. Treat with rocephine 1gm IM, zpack, cheratussin. Update if not improving with treatment. Pt agrees with plan.

## 2015-09-10 ENCOUNTER — Encounter: Payer: Self-pay | Admitting: *Deleted

## 2015-09-22 ENCOUNTER — Ambulatory Visit (INDEPENDENT_AMBULATORY_CARE_PROVIDER_SITE_OTHER): Payer: Managed Care, Other (non HMO) | Admitting: Family Medicine

## 2015-09-22 ENCOUNTER — Encounter: Payer: Self-pay | Admitting: Family Medicine

## 2015-09-22 ENCOUNTER — Ambulatory Visit (INDEPENDENT_AMBULATORY_CARE_PROVIDER_SITE_OTHER)
Admission: RE | Admit: 2015-09-22 | Discharge: 2015-09-22 | Disposition: A | Discharge status: Home / Self Care | Payer: Managed Care, Other (non HMO) | Source: Ambulatory Visit | Attending: Family Medicine | Admitting: Family Medicine

## 2015-09-22 VITALS — BP 104/70 | HR 98 | Temp 97.6°F | Wt 244.5 lb

## 2015-09-22 DIAGNOSIS — J189 Pneumonia, unspecified organism: Secondary | ICD-10-CM

## 2015-09-22 MED ORDER — GUAIFENESIN-CODEINE 100-10 MG/5ML PO SYRP: 5.0000 mL | ORAL_SOLUTION | Freq: Every evening | ORAL | Status: DC | PRN

## 2015-09-22 NOTE — Patient Instructions (Signed)
Get a flu shot when you feel better.   Go to the lab on the way out.  We'll contact you with your xray report. Use the cough medicine in the meantime.  Update us as needed.   Take care.  Glad to see you.

## 2015-09-22 NOTE — Progress Notes (Signed)
Pre visit review using our clinic review tool, if applicable. No additional management support is needed unless otherwise documented below in the visit note.  Prev with CAP, s/p ABX, here for follow up.  The cough continues.  He is globally better than prev but still with some occ discolored sputum.  He doesn't have inc WOB now.  No wheeze.  No fevers.  Not sleeping well, from cough.  No rash.  No ear pain.  Cough med helped prev, but ran out.  No ADE on cough medicine prev.    Meds, vitals, and allergies reviewed.   ROS: See HPI.  Otherwise, noncontributory.  nad ncat Tm wnl Nasal and OP exam wnl Neck supple, no LA rrr Ctab, dry cough noted, no wheeze, no rales abd soft Ext w/o edema Speaking in complete sentences.  Well appearing except for dry cough noted at exam.

## 2015-09-22 NOTE — Assessment & Plan Note (Signed)
Improved, likely with cough that is a lingering sx but should gradually resolve.  Recheck cxr, restart cough medicine, update us if not improving.  He agrees.  Okay for outpatient f/u.

## 2015-10-30 ENCOUNTER — Other Ambulatory Visit: Payer: Self-pay | Admitting: Family Medicine

## 2015-11-23 ENCOUNTER — Encounter: Payer: Managed Care, Other (non HMO) | Admitting: Family Medicine

## 2016-02-10 ENCOUNTER — Ambulatory Visit (INDEPENDENT_AMBULATORY_CARE_PROVIDER_SITE_OTHER): Payer: Managed Care, Other (non HMO) | Admitting: Family Medicine

## 2016-02-10 ENCOUNTER — Encounter: Payer: Self-pay | Admitting: Family Medicine

## 2016-02-10 VITALS — BP 124/98 | HR 93 | Temp 98.1°F | Ht 68.25 in | Wt 233.8 lb

## 2016-02-10 DIAGNOSIS — E785 Hyperlipidemia, unspecified: Secondary | ICD-10-CM

## 2016-02-10 DIAGNOSIS — IMO0001 Reserved for inherently not codable concepts without codable children: Secondary | ICD-10-CM

## 2016-02-10 DIAGNOSIS — I10 Essential (primary) hypertension: Secondary | ICD-10-CM | POA: Diagnosis not present

## 2016-02-10 DIAGNOSIS — Z23 Encounter for immunization: Secondary | ICD-10-CM | POA: Diagnosis not present

## 2016-02-10 DIAGNOSIS — F411 Generalized anxiety disorder: Secondary | ICD-10-CM

## 2016-02-10 DIAGNOSIS — Z0000 Encounter for general adult medical examination without abnormal findings: Secondary | ICD-10-CM | POA: Diagnosis not present

## 2016-02-10 MED ORDER — CITALOPRAM HYDROBROMIDE 20 MG PO TABS: 20.0000 mg | ORAL_TABLET | Freq: Every day | ORAL | Status: DC

## 2016-02-10 MED ORDER — LISINOPRIL 10 MG PO TABS: ORAL_TABLET | ORAL | Status: DC

## 2016-02-10 NOTE — Assessment & Plan Note (Signed)
Check FLP when returns fasting. rec goal LDL <100 given fmhx

## 2016-02-10 NOTE — Assessment & Plan Note (Signed)
Discussed healthy diet and lifestyle changes to affect sustainable weight loss  

## 2016-02-10 NOTE — Assessment & Plan Note (Signed)
Off ACEI for last 1 month - will restart today. Anticipate good control on med.

## 2016-02-10 NOTE — Progress Notes (Signed)
BP 124/98 mmHg  Pulse 93  Temp(Src) 98.1 F (36.7 C)  Ht 5' 8.25" (1.734 m)  Wt 233 lb 12.8 oz (106.051 kg)  BMI 35.27 kg/m2  SpO2 97%   CC: CPE  Subjective:    Patient ID: Francisco Mullen, male    DOB: [birth year], 45 y.o.   MRN: [id number]  HPI: Francisco Mullen is a 45 y.o. male presenting on 02/10/2016 for Annual Exam and Medication Refill   Dealing with father's metastatic colon cancer - hospice involved.  HTN - bp elevated today - ran out of lisinopril 2 wks ago.  Anxiety - celexa was helpful.   Ate cashews 9am.   Preventative: Didn't receive this year Tdap 2012 Hep A/B 2015 - desires to finish course.  Seat belt use discussed Sunscreen use discussed. No changing moles on skin.   Caffeine: diet drinks Lives with wife, Francisco Mullen, and daughter, 1 dog Occupation: IT Dance movement psychotherapist  Edu: college  Activity: no regular exercise  Diet: diet drinks, fruits/vegetables daily   Relevant past medical, surgical, family and social history reviewed and updated as indicated. Interim medical history since our last visit reviewed. Allergies and medications reviewed and updated. Current Outpatient Prescriptions on File Prior to Visit  Medication Sig  . acetaminophen (TYLENOL) 500 MG tablet Take 1,000 mg by mouth every 6 (six) hours as needed.  . Ascorbic Acid (VITAMIN C) 1000 MG tablet Take 1,000 mg by mouth daily.  Marland Kitchen b complex vitamins tablet Take 1 tablet by mouth daily.  Marland Kitchen EPINEPHrine (EPI-PEN) 0.3 mg/0.3 mL DEVI Inject 0.3 mLs (0.3 mg total) into the muscle once.  Marland Kitchen ibuprofen (ADVIL,MOTRIN) 800 MG tablet TAKE ONE TABLET BY MOUTH EVERY 8 HOURS AS NEEDED FOR MODERATE  PAIN  . Multiple Vitamin (MULTIVITAMIN) tablet Take 1 tablet by mouth daily.   No current facility-administered medications on file prior to visit.    Review of Systems  Constitutional: Positive for unexpected weight change (11 lb weight loss with stress of father's illness). Negative for fever, chills,  activity change, appetite change and fatigue.  HENT: Negative for hearing loss.   Eyes: Negative for visual disturbance.  Respiratory: Negative for cough, chest tightness, shortness of breath and wheezing.   Cardiovascular: Negative for chest pain, palpitations and leg swelling.  Gastrointestinal: Negative for nausea, vomiting, abdominal pain, diarrhea, constipation, blood in stool and abdominal distention.  Genitourinary: Negative for hematuria and difficulty urinating.  Musculoskeletal: Negative for myalgias, arthralgias and neck pain.  Skin: Negative for rash.  Neurological: Negative for dizziness, seizures, syncope and headaches.  Hematological: Negative for adenopathy. Does not bruise/bleed easily.  Psychiatric/Behavioral: Positive for dysphoric mood. The patient is nervous/anxious.    Per HPI unless specifically indicated in ROS section     Objective:    BP 124/98 mmHg  Pulse 93  Temp(Src) 98.1 F (36.7 C)  Ht 5' 8.25" (1.734 m)  Wt 233 lb 12.8 oz (106.051 kg)  BMI 35.27 kg/m2  SpO2 97%  Wt Readings from Last 3 Encounters:  02/10/16 233 lb 12.8 oz (106.051 kg)  09/22/15 244 lb 8 oz (110.904 kg)  09/01/15 240 lb 4 oz (108.977 kg)    Physical Exam  Constitutional: He is oriented to person, place, and time. He appears well-developed and well-nourished. No distress.  HENT:  Head: Normocephalic and atraumatic.  Right Ear: Hearing, tympanic membrane, external ear and ear canal normal.  Left Ear: Hearing, tympanic membrane, external ear and ear canal normal.  Nose: Nose normal.  Mouth/Throat: Uvula is midline, oropharynx is clear and moist and mucous membranes are normal. No oropharyngeal exudate, posterior oropharyngeal edema or posterior oropharyngeal erythema.  Eyes: Conjunctivae and EOM are normal. Pupils are equal, round, and reactive to light. No scleral icterus.  Neck: Normal range of motion. Neck supple. No thyromegaly present.  Cardiovascular: Normal rate, regular  rhythm, normal heart sounds and intact distal pulses.   No murmur heard. Pulses:      Radial pulses are 2+ on the right side, and 2+ on the left side.  Pulmonary/Chest: Effort normal and breath sounds normal. No respiratory distress. He has no wheezes. He has no rales.  Abdominal: Soft. Bowel sounds are normal. He exhibits no distension and no mass. There is no tenderness. There is no rebound and no guarding.  Musculoskeletal: Normal range of motion. He exhibits no edema.  Lymphadenopathy:    He has no cervical adenopathy.  Neurological: He is alert and oriented to person, place, and time.  CN grossly intact, station and gait intact  Skin: Skin is warm and dry. No rash noted.  Psychiatric: He has a normal mood and affect. His behavior is normal. Judgment and thought content normal.  Nursing note and vitals reviewed.      Assessment & Plan:  Pneumovax today given hx CAP 08/2015 Problem List Items Addressed This Visit    Obesity, Class II, BMI 35-39.9, with comorbidity (HCC)    Discussed healthy diet and lifestyle changes to affect sustainable weight loss.      Hypertension    Off ACEI for last 1 month - will restart today. Anticipate good control on med.      Relevant Medications   lisinopril (PRINIVIL,ZESTRIL) 10 MG tablet   HLD (hyperlipidemia)    Check FLP when returns fasting. rec goal LDL <100 given fmhx      Relevant Medications   lisinopril (PRINIVIL,ZESTRIL) 10 MG tablet   Other Relevant Orders   Lipid panel   Comprehensive metabolic panel   TSH   GAD (generalized anxiety disorder)    Restart celexa - which has been helpful.  Family stressors - father currently ill with metastatic colon cancer Discussed [facility] for children if needed.      Health maintenance examination - Primary    Preventative protocols reviewed and updated unless pt declined. Discussed healthy diet and lifestyle.           Follow up plan: Return in about 1 year (around 02/09/2017), or  as needed, for annual exam, prior fasting for blood work.  Francisco BoydenJavier Annistyn Depass, MD

## 2016-02-10 NOTE — Assessment & Plan Note (Signed)
Preventative protocols reviewed and updated unless pt declined. Discussed healthy diet and lifestyle.  

## 2016-02-10 NOTE — Patient Instructions (Addendum)
Finish hep A/B series - second shot today. Return after 2 months for third. Pneumovax today. Restart lisinopril, restart celexa 20mg  daily.  Return fasting at your convenience for lab visit only.  Good to see you today, call us with questions.  Try to bring prepared lunch to work a few days a week - as prepared meals are usually healthier than convenience food.   Health Maintenance, Male A healthy lifestyle and preventative care can promote health and wellness.  Maintain regular health, dental, and eye exams.  Eat a healthy diet. Foods like vegetables, fruits, whole grains, low-fat dairy products, and lean protein foods contain the nutrients you need and are low in calories. Decrease your intake of foods high in solid fats, added sugars, and salt. Get information about a proper diet from your health care provider, if necessary.  Regular physical exercise is one of the most important things you can do for your health. Most adults should get at least 150 minutes of moderate-intensity exercise (any activity that increases your heart rate and causes you to sweat) each week. In addition, most adults need muscle-strengthening exercises on 2 or more days a week.   Maintain a healthy weight. The body mass index (BMI) is a screening tool to identify possible weight problems. It provides an estimate of body fat based on height and weight. Your health care provider can find your BMI and can help you achieve or maintain a healthy weight. For males 20 years and older:  A BMI below 18.5 is considered underweight.  A BMI of 18.5 to 24.9 is normal.  A BMI of 25 to 29.9 is considered overweight.  A BMI of 30 and above is considered obese.  Maintain normal blood lipids and cholesterol by exercising and minimizing your intake of saturated fat. Eat a balanced diet with plenty of fruits and vegetables. Blood tests for lipids and cholesterol should begin at age 47 and be repeated every 5 years. If your lipid or  cholesterol levels are high, you are over age 47, or you are at high risk for heart disease, you may need your cholesterol levels checked more frequently.Ongoing high lipid and cholesterol levels should be treated with medicines if diet and exercise are not working.  If you smoke, find out from your health care provider how to quit. If you do not use tobacco, do not start.  Lung cancer screening is recommended for adults aged 55-80 years who are at high risk for developing lung cancer because of a history of smoking. A yearly low-dose CT scan of the lungs is recommended for people who have at least a 30-pack-year history of smoking and are current smokers or have quit within the past 15 years. A pack year of smoking is smoking an average of 1 pack of cigarettes a day for 1 year (for example, a 30-pack-year history of smoking could mean smoking 1 pack a day for 30 years or 2 packs a day for 15 years). Yearly screening should continue until the smoker has stopped smoking for at least 15 years. Yearly screening should be stopped for people who develop a health problem that would prevent them from having lung cancer treatment.  If you choose to drink alcohol, do not have more than 2 drinks per day. One drink is considered to be 12 oz (360 mL) of beer, 5 oz (150 mL) of wine, or 1.5 oz (45 mL) of liquor.  Avoid the use of street drugs. Do not share needles with anyone.  Ask for help if you need support or instructions about stopping the use of drugs.  High blood pressure causes heart disease and increases the risk of stroke. High blood pressure is more likely to develop in:  People who have blood pressure in the end of the normal range (100-139/85-89 mm Hg).  People who are overweight or obese.  People who are African American.  If you are 54-54 years of age, have your blood pressure checked every 3-5 years. If you are 40 years of age or older, have your blood pressure checked every year. You should have  your blood pressure measured twice--once when you are at a hospital or clinic, and once when you are not at a hospital or clinic. Record the average of the two measurements. To check your blood pressure when you are not at a hospital or clinic, you can use:  An automated blood pressure machine at a pharmacy.  A home blood pressure monitor.  If you are 105-82 years old, ask your health care provider if you should take aspirin to prevent heart disease.  Diabetes screening involves taking a blood sample to check your fasting blood sugar level. This should be done once every 3 years after age 58 if you are at a normal weight and without risk factors for diabetes. Testing should be considered at a younger age or be carried out more frequently if you are overweight and have at least 1 risk factor for diabetes.  Colorectal cancer can be detected and often prevented. Most routine colorectal cancer screening begins at the age of 34 and continues through age 23. However, your health care provider may recommend screening at an earlier age if you have risk factors for colon cancer. On a yearly basis, your health care provider may provide home test kits to check for hidden blood in the stool. A small camera at the end of a tube may be used to directly examine the colon (sigmoidoscopy or colonoscopy) to detect the earliest forms of colorectal cancer. Talk to your health care provider about this at age 20 when routine screening begins. A direct exam of the colon should be repeated every 5-10 years through age 49, unless early forms of precancerous polyps or small growths are found.  People who are at an increased risk for hepatitis B should be screened for this virus. You are considered at high risk for hepatitis B if:  You were born in a country where hepatitis B occurs often. Talk with your health care provider about which countries are considered high risk.  Your parents were born in a high-risk country and you  have not received a shot to protect against hepatitis B (hepatitis B vaccine).  You have HIV or AIDS.  You use needles to inject street drugs.  You live with, or have sex with, someone who has hepatitis B.  You are a man who has sex with other men (MSM).  You get hemodialysis treatment.  You take certain medicines for conditions like cancer, organ transplantation, and autoimmune conditions.  Hepatitis C blood testing is recommended for all people born from [birth year] through 1967 and any individual with known risk factors for hepatitis C.  Healthy men should no longer receive prostate-specific antigen (PSA) blood tests as part of routine cancer screening. Talk to your health care provider about prostate cancer screening.  Testicular cancer screening is not recommended for adolescents or adult males who have no symptoms. Screening includes self-exam, a health care provider exam, and  other screening tests. Consult with your health care provider about any symptoms you have or any concerns you have about testicular cancer.  Practice safe sex. Use condoms and avoid high-risk sexual practices to reduce the spread of sexually transmitted infections (STIs).  You should be screened for STIs, including gonorrhea and chlamydia if:  You are sexually active and are younger than 24 years.  You are older than 24 years, and your health care provider tells you that you are at risk for this type of infection.  Your sexual activity has changed since you were last screened, and you are at an increased risk for chlamydia or gonorrhea. Ask your health care provider if you are at risk.  If you are at risk of being infected with HIV, it is recommended that you take a prescription medicine daily to prevent HIV infection. This is called pre-exposure prophylaxis (PrEP). You are considered at risk if:  You are a man who has sex with other men (MSM).  You are a heterosexual man who is sexually active with multiple  partners.  You take drugs by injection.  You are sexually active with a partner who has HIV.  Talk with your health care provider about whether you are at high risk of being infected with HIV. If you choose to begin PrEP, you should first be tested for HIV. You should then be tested every 3 months for as long as you are taking PrEP.  Use sunscreen. Apply sunscreen liberally and repeatedly throughout the day. You should seek shade when your shadow is shorter than you. Protect yourself by wearing long sleeves, pants, a wide-brimmed hat, and sunglasses year round whenever you are outdoors.  Tell your health care provider of new moles or changes in moles, especially if there is a change in shape or color. Also, tell your health care provider if a mole is larger than the size of a pencil eraser.  A one-time screening for abdominal aortic aneurysm (AAA) and surgical repair of large AAAs by ultrasound is recommended for men aged 43-75 years who are current or former smokers.  Stay current with your vaccines (immunizations).   This information is not intended to replace advice given to you by your health care provider. Make sure you discuss any questions you have with your health care provider.   Document Released: 04/07/2008 Document Revised: 10/31/2014 Document Reviewed: 03/07/2011 Elsevier Interactive Patient Education [employer].

## 2016-02-10 NOTE — Progress Notes (Signed)
Pre visit review using our clinic review tool, if applicable. No additional management support is needed unless otherwise documented below in the visit note. 

## 2016-02-10 NOTE — Addendum Note (Signed)
Addended by: Dixie DialsVALENCIA, Alahna Dunne on: 02/10/2016 10:30 AM   Modules accepted: Orders

## 2016-02-10 NOTE — Assessment & Plan Note (Signed)
Restart celexa - which has been helpful.  Family stressors - father currently ill with metastatic colon cancer Discussed [facility] for children if needed.

## 2016-02-12 ENCOUNTER — Other Ambulatory Visit: Payer: Managed Care, Other (non HMO)

## 2016-04-12 ENCOUNTER — Ambulatory Visit: Payer: Managed Care, Other (non HMO)

## 2016-04-15 ENCOUNTER — Ambulatory Visit: Payer: Managed Care, Other (non HMO) | Admitting: Family Medicine

## 2016-04-15 ENCOUNTER — Ambulatory Visit (INDEPENDENT_AMBULATORY_CARE_PROVIDER_SITE_OTHER): Payer: Managed Care, Other (non HMO) | Admitting: Family Medicine

## 2016-04-15 ENCOUNTER — Telehealth: Payer: Self-pay

## 2016-04-15 ENCOUNTER — Encounter: Payer: Self-pay | Admitting: Family Medicine

## 2016-04-15 VITALS — BP 120/70 | HR 88 | Temp 97.8°F | Ht 68.25 in | Wt 241.8 lb

## 2016-04-15 DIAGNOSIS — S060X0A Concussion without loss of consciousness, initial encounter: Secondary | ICD-10-CM

## 2016-04-15 DIAGNOSIS — M545 Low back pain, unspecified: Secondary | ICD-10-CM

## 2016-04-15 DIAGNOSIS — H9313 Tinnitus, bilateral: Secondary | ICD-10-CM | POA: Diagnosis not present

## 2016-04-15 MED ORDER — NORTRIPTYLINE HCL 25 MG PO CAPS: 25.0000 mg | ORAL_CAPSULE | Freq: Every day | ORAL | Status: DC

## 2016-04-15 MED ORDER — TIZANIDINE HCL 4 MG PO TABS: 4.0000 mg | ORAL_TABLET | Freq: Every day | ORAL | Status: DC
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
## 2016-04-15 NOTE — Telephone Encounter (Signed)
Pt has appt with Dr Patsy Lageropland 04/15/16 at 3:15.

## 2016-04-15 NOTE — Progress Notes (Signed)
Pre visit review using our clinic review tool, if applicable. No additional management support is needed unless otherwise documented below in the visit note. 

## 2016-04-15 NOTE — Telephone Encounter (Signed)
PLEASE NOTE: All timestamps contained within this report are represented as [place]. CONFIDENTIALTY NOTICE: This fax transmission is intended only for the addressee. It contains information that is legally privileged, confidential or otherwise protected from use or disclosure. If you are not the intended recipient, you are strictly prohibited from reviewing, disclosing, copying using or disseminating any of this information or taking any action in reliance on or regarding this information. If you have received this fax in error, please notify us immediately by telephone so that we can arrange for its return to us. Phone: [phone], Toll-Free: [phone], Fax: [phone] Page: 1 of 2 Call Id: [id number] [facility] - Client TELEPHONE ADVICE RECORD [facility] Patient Name: Francisco LeveringMATTHEW Golebiewski Gender: Male DOB: 1971-02-09 Age: [age] Y 3 M 23 D Return Phone Number: [phone] (Primary), [phone] (Secondary) Address: City[place]State[place]: [place] [id number] Client [facility] - Client Client Site [facility] Physician Eustaquio BoydenGutierrez, Javier - MD Contact Type Call Who Is Calling Patient / Member / Family / Caregiver Call Type Triage / Clinical Relationship To Patient Self Return Phone Number [phone] (Primary) Chief Complaint Headache Reason for Call Symptomatic / Request for Health Information Initial Comment Caller states he fell and hit head hard Monday night. Gashed and bruised it. On Tuesday had bad headaches. Wednesday throwing up and couldn't focus. Current sx headache behind eyes/around temple and focusing a little better PreDisposition Call Doctor Translation No Nurse Assessment Nurse: Elroy ChannelIrvin, RN, Rosey Batheresa Date/Time Lamount Cohen(Eastern Time): 04/14/2016 4:27:40 PM Confirm and document reason for call. If symptomatic, describe symptoms. You must click the next button to save  text entered. ---Caller states he fell and hit head hard Monday night. Gashed and bruised it. On Tuesday had bad headaches. Wednesday throwing up and couldn't focus. Current sx headache behind eyes/ around temple and focusing a little better. No LOC. Lorayne MarekGash is back of head. Has back pain and general sores Has the patient traveled out of the country within the last 30 days? ---No Does the patient have any new or worsening symptoms? ---Yes Will a triage be completed? ---Yes Related visit to physician within the last 2 weeks? ---No Does the PT have any chronic conditions? (i.e. diabetes, asthma, etc.) ---Yes List chronic conditions. ---hypertension Is this a behavioral health or substance abuse call? ---No Guidelines Guideline Title Affirmed Question Affirmed Notes Nurse Date/Time (Eastern Time) Head Injury [1] After 72 hours AND [2] headache persists Shellee Milorvin, RN, Teresa 04/14/2016 4:30:18 PM Disp. Time Lamount Cohen(Eastern Time) Disposition Final User PLEASE NOTE: All timestamps contained within this report are represented as [place]. CONFIDENTIALTY NOTICE: This fax transmission is intended only for the addressee. It contains information that is legally privileged, confidential or otherwise protected from use or disclosure. If you are not the intended recipient, you are strictly prohibited from reviewing, disclosing, copying using or disseminating any of this information or taking any action in reliance on or regarding this information. If you have received this fax in error, please notify us immediately by telephone so that we can arrange for its return to us. Phone: [phone], Toll-Free: [phone], Fax: [phone] Page: 2 of 2 Call Id: [id number] 04/14/2016 4:36:17 PM See PCP When Office is Open (within 3 days) Yes Elroy ChannelIrvin, RN, Suezanne Cheshireeresa Caller Understands: Yes Disagree/Comply: Comply Care Advice Given Per Guideline SEE PCP WITHIN 3 DAYS: PAIN MEDICINES: IBUPROFEN (E.G., MOTRIN,  ADVIL): * Take 400 mg (two 200 mg pills) by mouth every  6 hours as needed. * For pain relief, take acetaminophen, ibuprofen, or naproxen. CAUTION - NSAIDS (E.G., IBUPROFEN, NAPROXEN): * GASTROINTESTINAL RISK: There is an increased risk of stomach ulcers, GI bleeding, perforation. * CARDIOVASCULAR RISK: There may be an increased risk of heart attack and stroke. CALL BACK IF: * You become worse. COGNITIVE REST AFTER A CONCUSSION: * Brain rest (cognitive rest) can help a person get better after a concussion. * Too much mental activity can put extra strain on the brain. Intense mental activity increases neurometabolic processes in the brain. * It is best to avoid or decrease activities such as: working on a computer, tablet, phone, or any screen; listening to loud music; reading; texting; and playing video or computer games. CARE ADVICE given per Head Injury (Adult) guideline. Referrals REFERRED TO PCP OFFICE REFERRED TO PCP OFFICE

## 2016-04-15 NOTE — Patient Instructions (Signed)

## 2016-08-24 ENCOUNTER — Telehealth: Payer: Self-pay | Admitting: Family Medicine

## 2016-08-24 ENCOUNTER — Telehealth: Payer: Self-pay | Admitting: *Deleted

## 2016-08-24 NOTE — Telephone Encounter (Signed)
Will fill out when he completes fasting labs.

## 2016-08-24 NOTE — Telephone Encounter (Signed)
Yes. He will need to be fasting. Thanks!

## 2016-08-24 NOTE — Telephone Encounter (Signed)
PTs wife came in with a biometric screening form. Please fill it out and give it back to patient. Please call [phone]. Form placed in Rx tower.

## 2016-08-24 NOTE — Telephone Encounter (Signed)
Pt wife dropped off forms for insurance. Last cpe was in April 2017 but it appears no labs were drawn. Orders are still in, can he still come for labs in Nov?

## 2016-09-02 ENCOUNTER — Other Ambulatory Visit (INDEPENDENT_AMBULATORY_CARE_PROVIDER_SITE_OTHER): Payer: Managed Care, Other (non HMO)

## 2016-09-02 ENCOUNTER — Ambulatory Visit (INDEPENDENT_AMBULATORY_CARE_PROVIDER_SITE_OTHER): Payer: Managed Care, Other (non HMO)

## 2016-09-02 DIAGNOSIS — Z23 Encounter for immunization: Secondary | ICD-10-CM | POA: Diagnosis not present

## 2016-09-02 DIAGNOSIS — E785 Hyperlipidemia, unspecified: Secondary | ICD-10-CM | POA: Diagnosis not present

## 2016-09-02 LAB — COMPREHENSIVE METABOLIC PANEL
ALBUMIN: 4.3 g/dL (ref 3.5–5.2)
ALK PHOS: 60 U/L (ref 39–117)
ALT: 30 U/L (ref 0–53)
AST: 18 U/L (ref 0–37)
BILIRUBIN TOTAL: 0.7 mg/dL (ref 0.2–1.2)
BUN: 13 mg/dL (ref 6–23)
CALCIUM: 9.9 mg/dL (ref 8.4–10.5)
CO2: 28 mEq/L (ref 19–32)
Chloride: 104 mEq/L (ref 96–112)
Creatinine, Ser: 1.11 mg/dL (ref 0.40–1.50)
GFR: 75.24 mL/min (ref >60.00)
Glucose, Bld: 88 mg/dL (ref 70–99)
Potassium: 4 mEq/L (ref 3.5–5.1)
Sodium: 140 mEq/L (ref 135–145)
TOTAL PROTEIN: 7.2 g/dL (ref 6.0–8.3)

## 2016-09-02 LAB — LIPID PANEL
CHOL/HDL RATIO: 6
CHOLESTEROL: 196 mg/dL (ref 0–200)
HDL: 31.4 mg/dL — ABNORMAL LOW (ref >39.00)
NonHDL: 164.9
TRIGLYCERIDES: 276 mg/dL — AB (ref 0.0–149.0)
VLDL: 55.2 mg/dL — ABNORMAL HIGH (ref 0.0–40.0)

## 2016-09-02 LAB — LDL CHOLESTEROL, DIRECT: LDL DIRECT: 139 mg/dL

## 2016-09-02 LAB — TSH: TSH: 2.4 u[IU]/mL (ref 0.35–4.50)

## 2016-10-20 ENCOUNTER — Telehealth: Payer: Self-pay | Admitting: Family Medicine

## 2016-10-20 NOTE — Telephone Encounter (Signed)
Pt brings reasonable alternative exemption form to fill out for not meeting BMI or cholesterol goals. Currently on antihypertensive. Will review statin appropriateness at next CPE  Form filled out.

## 2017-02-01 ENCOUNTER — Encounter: Payer: Self-pay | Admitting: Family Medicine

## 2017-02-01 ENCOUNTER — Ambulatory Visit (INDEPENDENT_AMBULATORY_CARE_PROVIDER_SITE_OTHER): Payer: BLUE CROSS/BLUE SHIELD | Admitting: Family Medicine

## 2017-02-01 VITALS — BP 136/86 | HR 96 | Temp 97.9°F | Ht 68.25 in | Wt 236.5 lb

## 2017-02-01 DIAGNOSIS — Z1211 Encounter for screening for malignant neoplasm of colon: Secondary | ICD-10-CM | POA: Diagnosis not present

## 2017-02-01 DIAGNOSIS — E669 Obesity, unspecified: Secondary | ICD-10-CM

## 2017-02-01 DIAGNOSIS — M545 Low back pain, unspecified: Secondary | ICD-10-CM | POA: Insufficient documentation

## 2017-02-01 DIAGNOSIS — G4700 Insomnia, unspecified: Secondary | ICD-10-CM | POA: Diagnosis not present

## 2017-02-01 DIAGNOSIS — F411 Generalized anxiety disorder: Secondary | ICD-10-CM

## 2017-02-01 DIAGNOSIS — Z0000 Encounter for general adult medical examination without abnormal findings: Secondary | ICD-10-CM

## 2017-02-01 DIAGNOSIS — G8929 Other chronic pain: Secondary | ICD-10-CM

## 2017-02-01 DIAGNOSIS — IMO0001 Reserved for inherently not codable concepts without codable children: Secondary | ICD-10-CM

## 2017-02-01 DIAGNOSIS — Z800 Family history of malignant neoplasm of digestive organs: Secondary | ICD-10-CM | POA: Diagnosis not present

## 2017-02-01 DIAGNOSIS — I10 Essential (primary) hypertension: Secondary | ICD-10-CM | POA: Diagnosis not present

## 2017-02-01 DIAGNOSIS — E785 Hyperlipidemia, unspecified: Secondary | ICD-10-CM | POA: Diagnosis not present

## 2017-02-01 MED ORDER — LISINOPRIL 10 MG PO TABS: ORAL_TABLET | ORAL | 3 refills | Status: DC

## 2017-02-01 MED ORDER — NORTRIPTYLINE HCL 25 MG PO CAPS: 25.0000 mg | ORAL_CAPSULE | Freq: Every day | ORAL | 6 refills | Status: DC

## 2017-02-01 MED ORDER — LORAZEPAM 1 MG PO TABS: 0.5000 mg | ORAL_TABLET | Freq: Every day | ORAL | 0 refills | Status: DC | PRN

## 2017-02-01 NOTE — Assessment & Plan Note (Signed)
Present since fall summer 2017. Discussed benefits of regular walking, encouraged weight loss.

## 2017-02-01 NOTE — Progress Notes (Signed)
BP 136/86   Pulse 96   Temp 97.9 F (36.6 C) (Oral)   Ht 5' 8.25" (1.734 m)   Wt 236 lb 8 oz (107.3 kg)   BMI 35.70 kg/m    CC: CPE Subjective:    Patient ID: Francisco Mullen, male    DOB: 1971-04-12, 46 y.o.   MRN: [id number]  HPI: Francisco Mullen is a 46 y.o. male presenting on 02/01/2017 for Annual Exam (sleep issues (going to sleep and staying asleep))   Off celexa - no significant anxiety issues.  May be developing intolerance to all nuts - nausea/vomiting, diarrhea.  Father passed away from metastatic colon cancer last year.  Requests ativan for upcoming plane travel to [place] - has taken med and tolerated well in past.   Insomnia - worsening. Both sleep initiation and maintenance insomnia. Averages 2 hours sleep/night. + snoring. No PNDyspnea. Non restorative sleep. Daytime somnolence but doesn't easily fall asleep. Pamelor may have helped. No improvement off caffeine. Has tried alcohol, tylenol PM.   Preventative: Colon cancer screening - recommended colonoscopy given fmhx.  Tdap 2012 Hep A/B 2015 - completed course Seat belt use discussed Sunscreen use discussed. No changing moles on skin.  Ex smoker - quit 2007 Alcohol - occasional  Caffeine: diet drinks Lives with wife Marchelle Folks), Marianne Sofia, and daughter, 1 dog Occupation: IT Dance movement psychotherapist  Edu: college  Activity: no regular exercise  Diet: diet drinks, more water, fruits/vegetables daily   Relevant past medical, surgical, family and social history reviewed and updated as indicated. Interim medical history since our last visit reviewed. Allergies and medications reviewed and updated. Outpatient Medications Prior to Visit  Medication Sig Dispense Refill  . acetaminophen (TYLENOL) 500 MG tablet Take 1,000 mg by mouth every 6 (six) hours as needed.    . Ascorbic Acid (VITAMIN C) 1000 MG tablet Take 1,000 mg by mouth daily.    Marland Kitchen b complex vitamins tablet Take 1 tablet by mouth daily.    Marland Kitchen  EPINEPHrine (EPI-PEN) 0.3 mg/0.3 mL DEVI Inject 0.3 mLs (0.3 mg total) into the muscle once. 1 Device 1  . ibuprofen (ADVIL,MOTRIN) 800 MG tablet TAKE ONE TABLET BY MOUTH EVERY 8 HOURS AS NEEDED FOR MODERATE  PAIN 30 tablet 3  . Multiple Vitamin (MULTIVITAMIN) tablet Take 1 tablet by mouth daily.    . citalopram (CELEXA) 20 MG tablet Take 1 tablet (20 mg total) by mouth daily. 90 tablet 3  . lisinopril (PRINIVIL,ZESTRIL) 10 MG tablet Take one tablet daily 90 tablet 3  . tiZANidine (ZANAFLEX) 4 MG tablet Take 1 tablet (4 mg total) by mouth daily. 30 tablet 2  . nortriptyline (PAMELOR) 25 MG capsule Take 1 capsule (25 mg total) by mouth at bedtime. (Patient not taking: Reported on 02/01/2017) 30 capsule 2   No facility-administered medications prior to visit.      Per HPI unless specifically indicated in ROS section below Review of Systems  Constitutional: Negative for activity change, appetite change, chills, fatigue, fever and unexpected weight change.  HENT: Negative for hearing loss.   Eyes: Negative for visual disturbance.  Respiratory: Negative for cough, chest tightness, shortness of breath and wheezing.   Cardiovascular: Negative for chest pain, palpitations and leg swelling.  Gastrointestinal: Negative for abdominal distention, abdominal pain, blood in stool, constipation, diarrhea, nausea and vomiting.  Genitourinary: Negative for difficulty urinating and hematuria.  Musculoskeletal: Negative for arthralgias, myalgias and neck pain.  Skin: Negative for rash.  Neurological: Negative for dizziness, seizures, syncope  and headaches.  Hematological: Negative for adenopathy. Does not bruise/bleed easily.  Psychiatric/Behavioral: Negative for dysphoric mood. The patient is not nervous/anxious.        Objective:    BP 136/86   Pulse 96   Temp 97.9 F (36.6 C) (Oral)   Ht 5' 8.25" (1.734 m)   Wt 236 lb 8 oz (107.3 kg)   BMI 35.70 kg/m   Wt Readings from Last 3 Encounters:    02/01/17 236 lb 8 oz (107.3 kg)  04/15/16 241 lb 12 oz (109.7 kg)  02/10/16 233 lb 12.8 oz (106.1 kg)    Physical Exam  Constitutional: He is oriented to person, place, and time. He appears well-developed and well-nourished. No distress.  HENT:  Head: Normocephalic and atraumatic.  Right Ear: Hearing, tympanic membrane, external ear and ear canal normal.  Left Ear: Hearing, tympanic membrane, external ear and ear canal normal.  Nose: Nose normal.  Mouth/Throat: Uvula is midline, oropharynx is clear and moist and mucous membranes are normal. No oropharyngeal exudate, posterior oropharyngeal edema or posterior oropharyngeal erythema.  Eyes: Conjunctivae and EOM are normal. Pupils are equal, round, and reactive to light. No scleral icterus.  Neck: Normal range of motion. Neck supple. No thyromegaly present.  Cardiovascular: Normal rate, regular rhythm, normal heart sounds and intact distal pulses.   No murmur heard. Pulses:      Radial pulses are 2+ on the right side, and 2+ on the left side.  Pulmonary/Chest: Effort normal and breath sounds normal. No respiratory distress. He has no wheezes. He has no rales.  Abdominal: Soft. Bowel sounds are normal. He exhibits no distension and no mass. There is no tenderness. There is no rebound and no guarding.  Musculoskeletal: Normal range of motion. He exhibits no edema.  Lymphadenopathy:    He has no cervical adenopathy.  Neurological: He is alert and oriented to person, place, and time.  CN grossly intact, station and gait intact  Skin: Skin is warm and dry. No rash noted.  Psychiatric: He has a normal mood and affect. His behavior is normal. Judgment and thought content normal.  Nursing note and vitals reviewed.  Results for orders placed or performed in visit on 09/02/16  Lipid panel  Result Value Ref Range   Cholesterol 196 0 - 200 mg/dL   Triglycerides 161.0 (H) 0.0 - 149.0 mg/dL   HDL 96.04 (L) >54.09 mg/dL   VLDL 81.1 (H) 0.0 - 91.4  mg/dL   Total CHOL/HDL Ratio 6    NonHDL 164.90   Comprehensive metabolic panel  Result Value Ref Range   Sodium 140 135 - 145 mEq/L   Potassium 4.0 3.5 - 5.1 mEq/L   Chloride 104 96 - 112 mEq/L   CO2 28 19 - 32 mEq/L   Glucose, Bld 88 70 - 99 mg/dL   BUN 13 6 - 23 mg/dL   Creatinine, Ser 7.82 0.40 - 1.50 mg/dL   Total Bilirubin 0.7 0.2 - 1.2 mg/dL   Alkaline Phosphatase 60 39 - 117 U/L   AST 18 0 - 37 U/L   ALT 30 0 - 53 U/L   Total Protein 7.2 6.0 - 8.3 g/dL   Albumin 4.3 3.5 - 5.2 g/dL   Calcium 9.9 8.4 - 95.6 mg/dL   GFR 21.30 >86.57 mL/min  TSH  Result Value Ref Range   TSH 2.40 0.35 - 4.50 uIU/mL  LDL cholesterol, direct  Result Value Ref Range   Direct LDL 139.0 mg/dL  Assessment & Plan:   Problem List Items Addressed This Visit    GAD (generalized anxiety disorder)    Stable period off celexa.       Health maintenance examination - Primary    Preventative protocols reviewed and updated unless pt declined. Discussed healthy diet and lifestyle.       HLD (hyperlipidemia)    Update FLP when returns fasting.  Goal LDL <100 given fmhx.       Relevant Medications   lisinopril (PRINIVIL,ZESTRIL) 10 MG tablet   Other Relevant Orders   Lipid panel   Comprehensive metabolic panel   TSH   Hypertension    Chronic, stable. Continue current regimen.       Relevant Medications   lisinopril (PRINIVIL,ZESTRIL) 10 MG tablet   Insomnia    Sleep initiation and maintenance insomnia. Reviewed sleep hygiene measures. Rx nortriptyline  nightly which was previously effective. Discussed possibility of sleep study to r/o OSA given body habitus, other sleep disorder.      Lower back pain    Present since fall summer 2017. Discussed benefits of regular walking, encouraged weight loss.       Obesity, Class II, BMI 35-39.9, with comorbidity (HCC)    Discussed healthy diet and lifestyle changes to affect sustainable weight loss.        Other Visit Diagnoses     Special screening for malignant neoplasms, colon       Relevant Orders   Ambulatory referral to Gastroenterology   Family history of colon cancer in father       Relevant Orders   Ambulatory referral to Gastroenterology       Follow up plan: Return in about 1 year (around 02/01/2018) for annual exam, prior fasting for blood work.  Eustaquio Boyden, MD

## 2017-02-01 NOTE — Assessment & Plan Note (Signed)
Discussed healthy diet and lifestyle changes to affect sustainable weight loss  

## 2017-02-01 NOTE — Assessment & Plan Note (Signed)
Chronic, stable. Continue current regimen. 

## 2017-02-01 NOTE — Progress Notes (Signed)
Pre visit review using our clinic review tool, if applicable. No additional management support is needed unless otherwise documented below in the visit note. 

## 2017-02-01 NOTE — Assessment & Plan Note (Signed)
Preventative protocols reviewed and updated unless pt declined. Discussed healthy diet and lifestyle.  

## 2017-02-01 NOTE — Assessment & Plan Note (Addendum)
Update FLP when returns fasting.  Goal LDL <100 given fmhx.

## 2017-02-01 NOTE — Assessment & Plan Note (Addendum)
Sleep initiation and maintenance insomnia. Reviewed sleep hygiene measures. Rx nortriptyline  nightly which was previously effective. Discussed possibility of sleep study to r/o OSA given body habitus, other sleep disorder.

## 2017-02-01 NOTE — Assessment & Plan Note (Signed)
Stable period off celexa.  

## 2017-02-01 NOTE — Patient Instructions (Addendum)
Return tomorrow for fasting labs. Think about sleep study.  Restart nortritpyline nightly.  We will refer you for colonoscopy. Ativan prescription provided today.   Sleep hygiene checklist: 1. Avoid naps during the day 2. Avoid stimulants such as caffeine and nicotine. Avoid bedtime alcohol (it can speed onset of sleep but the body's metabolism can cause awakenings). 3. All forms of exercise help ensure sound sleep - limit vigorous exercise to morning or late afternoon 4. Avoid food too close to bedtime including chocolate (which contains caffeine) 5. Soak up natural light 6. Establish regular bedtime routine. 7. Associate bed with sleep - avoid TV, computer or phone, reading while in bed. 8. Ensure pleasant, relaxing sleep environment - quiet, dark, cool room.   Health Maintenance, Male A healthy lifestyle and preventive care is important for your health and wellness. Ask your health care provider about what schedule of regular examinations is right for you. What should I know about weight and diet?  Eat a Healthy Diet  Eat plenty of vegetables, fruits, whole grains, low-fat dairy products, and lean protein.  Do not eat a lot of foods high in solid fats, added sugars, or salt. Maintain a Healthy Weight  Regular exercise can help you achieve or maintain a healthy weight. You should:  Do at least 150 minutes of exercise each week. The exercise should increase your heart rate and make you sweat (moderate-intensity exercise).  Do strength-training exercises at least twice a week. Watch Your Levels of Cholesterol and Blood Lipids  Have your blood tested for lipids and cholesterol every 5 years starting at 48 years of age. If you are at high risk for heart disease, you should start having your blood tested when you are 48 years old. You may need to have your cholesterol levels checked more often if:  Your lipid or cholesterol levels are high.  You are older than 48 years of age.  You  are at high risk for heart disease. What should I know about cancer screening? Many types of cancers can be detected early and may often be prevented. Lung Cancer  You should be screened every year for lung cancer if:  You are a current smoker who has smoked for at least 30 years.  You are a former smoker who has quit within the past 15 years.  Talk to your health care provider about your screening options, when you should start screening, and how often you should be screened. Colorectal Cancer  Routine colorectal cancer screening usually begins at 48 years of age and should be repeated every 5-10 years until you are 48 years old. You may need to be screened more often if early forms of precancerous polyps or small growths are found. Your health care provider may recommend screening at an earlier age if you have risk factors for colon cancer.  Your health care provider may recommend using home test kits to check for hidden blood in the stool.  A small camera at the end of a tube can be used to examine your colon (sigmoidoscopy or colonoscopy). This checks for the earliest forms of colorectal cancer. Prostate and Testicular Cancer  Depending on your age and overall health, your health care provider may do certain tests to screen for prostate and testicular cancer.  Talk to your health care provider about any symptoms or concerns you have about testicular or prostate cancer. Skin Cancer  Check your skin from head to toe regularly.  Tell your health care provider about any  new moles or changes in moles, especially if:  There is a change in a mole's size, shape, or color.  You have a mole that is larger than a pencil eraser.  Always use sunscreen. Apply sunscreen liberally and repeat throughout the day.  Protect yourself by wearing long sleeves, pants, a wide-brimmed hat, and sunglasses when outside. What should I know about heart disease, diabetes, and high blood pressure?  If you  are 37-24 years of age, have your blood pressure checked every 3-5 years. If you are 67 years of age or older, have your blood pressure checked every year. You should have your blood pressure measured twice-once when you are at a hospital or clinic, and once when you are not at a hospital or clinic. Record the average of the two measurements. To check your blood pressure when you are not at a hospital or clinic, you can use:  An automated blood pressure machine at a pharmacy.  A home blood pressure monitor.  Talk to your health care provider about your target blood pressure.  If you are between 59-37 years old, ask your health care provider if you should take aspirin to prevent heart disease.  Have regular diabetes screenings by checking your fasting blood sugar level.  If you are at a normal weight and have a low risk for diabetes, have this test once every three years after the age of 46.  If you are overweight and have a high risk for diabetes, consider being tested at a younger age or more often.  A one-time screening for abdominal aortic aneurysm (AAA) by ultrasound is recommended for men aged 65-75 years who are current or former smokers. What should I know about preventing infection? Hepatitis B  If you have a higher risk for hepatitis B, you should be screened for this virus. Talk with your health care provider to find out if you are at risk for hepatitis B infection. Hepatitis C  Blood testing is recommended for:  Everyone born from [birth year] through 1967.  Anyone with known risk factors for hepatitis C. Sexually Transmitted Diseases (STDs)  You should be screened each year for STDs including gonorrhea and chlamydia if:  You are sexually active and are younger than 48 years of age.  You are older than 48 years of age and your health care provider tells you that you are at risk for this type of infection.  Your sexual activity has changed since you were last screened and you are  at an increased risk for chlamydia or gonorrhea. Ask your health care provider if you are at risk.  Talk with your health care provider about whether you are at high risk of being infected with HIV. Your health care provider may recommend a prescription medicine to help prevent HIV infection. What else can I do?  Schedule regular health, dental, and eye exams.  Stay current with your vaccines (immunizations).  Do not use any tobacco products, such as cigarettes, chewing tobacco, and e-cigarettes. If you need help quitting, ask your health care provider.  Limit alcohol intake to no more than 2 drinks per day. One drink equals 12 ounces of beer, 5 ounces of wine, or 1 ounces of hard liquor.  Do not use street drugs.  Do not share needles.  Ask your health care provider for help if you need support or information about quitting drugs.  Tell your health care provider if you often feel depressed.  Tell your health care provider if you  have ever been abused or do not feel safe at home. This information is not intended to replace advice given to you by your health care provider. Make sure you discuss any questions you have with your health care provider. Document Released: 04/07/2008 Document Revised: 06/08/2016 Document Reviewed: 07/14/2015 Elsevier Interactive Patient Education  2017 [employer].

## 2017-02-02 ENCOUNTER — Other Ambulatory Visit: Payer: BLUE CROSS/BLUE SHIELD

## 2017-02-06 ENCOUNTER — Encounter: Payer: Self-pay | Admitting: Family Medicine

## 2017-02-14 ENCOUNTER — Encounter: Payer: Self-pay | Admitting: Gastroenterology

## 2017-02-14 ENCOUNTER — Encounter: Payer: Managed Care, Other (non HMO) | Admitting: Family Medicine

## 2017-02-21 ENCOUNTER — Ambulatory Visit (INDEPENDENT_AMBULATORY_CARE_PROVIDER_SITE_OTHER): Payer: BLUE CROSS/BLUE SHIELD | Admitting: Gastroenterology

## 2017-02-21 ENCOUNTER — Encounter: Payer: Self-pay | Admitting: Gastroenterology

## 2017-02-21 VITALS — BP 126/102 | HR 84 | Ht 70.0 in | Wt 234.4 lb

## 2017-02-21 DIAGNOSIS — Z800 Family history of malignant neoplasm of digestive organs: Secondary | ICD-10-CM | POA: Insufficient documentation

## 2017-02-21 MED ORDER — NA SULFATE-K SULFATE-MG SULF 17.5-3.13-1.6 GM/177ML PO SOLN: 1.0000 | ORAL | 0 refills | Status: DC

## 2017-02-21 NOTE — Patient Instructions (Signed)
You have been scheduled for a colonoscopy. Please follow written instructions given to you at your visit today.  Please pick up your prep supplies at the pharmacy within the next 1-3 days. If you use inhalers (even only as needed), please bring them with you on the day of your procedure. Your physician has requested that you go to www.startemmi.com and enter the access code given to you at your visit today. This web site gives a general overview about your procedure. However, you should still follow specific instructions given to you by our office regarding your preparation for the procedure.  Consider daily powder fiber supplement such as Benefiber or Citrucel.
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
## 2017-02-21 NOTE — Progress Notes (Signed)
I agree with the above note, plan 

## 2017-04-20 ENCOUNTER — Telehealth: Payer: Self-pay | Admitting: Gastroenterology

## 2017-04-20 ENCOUNTER — Encounter: Payer: Self-pay | Admitting: Gastroenterology

## 2017-04-20 MED ORDER — NA SULFATE-K SULFATE-MG SULF 17.5-3.13-1.6 GM/177ML PO SOLN: 1.0000 | ORAL | 0 refills | Status: DC

## 2017-04-20 NOTE — Telephone Encounter (Signed)
Pt notified the prescription has been resent

## 2017-04-23 HISTORY — PX: COLONOSCOPY: SHX174

## 2017-05-02 ENCOUNTER — Encounter: Payer: Self-pay | Admitting: Gastroenterology

## 2017-05-02 ENCOUNTER — Ambulatory Visit (AMBULATORY_SURGERY_CENTER): Payer: BLUE CROSS/BLUE SHIELD | Admitting: Gastroenterology

## 2017-05-02 VITALS — BP 119/84 | HR 82 | Temp 97.5°F | Resp 15 | Ht 70.0 in | Wt 234.0 lb

## 2017-05-02 DIAGNOSIS — Z1211 Encounter for screening for malignant neoplasm of colon: Secondary | ICD-10-CM

## 2017-05-02 DIAGNOSIS — Z800 Family history of malignant neoplasm of digestive organs: Secondary | ICD-10-CM

## 2017-05-02 DIAGNOSIS — K5730 Diverticulosis of large intestine without perforation or abscess without bleeding: Secondary | ICD-10-CM

## 2017-05-02 MED ORDER — SODIUM CHLORIDE 0.9 % IV SOLN: 500.0000 mL | INTRAVENOUS | Status: DC

## 2017-05-02 MED ORDER — SODIUM CHLORIDE 0.9 % IV SOLN
4.0000 mg | Freq: Once | INTRAVENOUS | Status: AC
  Administered 2017-05-02: 4 mg via INTRAVENOUS

## 2017-05-02 NOTE — Patient Instructions (Signed)
YOU HAD AN ENDOSCOPIC PROCEDURE TODAY AT THE [facility]:   Refer to the procedure report that was given to you for any specific questions about what was found during the examination.  If the procedure report does not answer your questions, please call your gastroenterologist to clarify.  If you requested that your care partner not be given the details of your procedure findings, then the procedure report has been included in a sealed envelope for you to review at your convenience later.  YOU SHOULD EXPECT: Some feelings of bloating in the abdomen. Passage of more gas than usual.  Walking can help get rid of the air that was put into your GI tract during the procedure and reduce the bloating. If you had a lower endoscopy (such as a colonoscopy or flexible sigmoidoscopy) you may notice spotting of blood in your stool or on the toilet paper. If you underwent a bowel prep for your procedure, you may not have a normal bowel movement for a few days.  Please Note:  You might notice some irritation and congestion in your nose or some drainage.  This is from the oxygen used during your procedure.  There is no need for concern and it should clear up in a day or so.  SYMPTOMS TO REPORT IMMEDIATELY:   Following lower endoscopy (colonoscopy or flexible sigmoidoscopy):  Excessive amounts of blood in the stool  Significant tenderness or worsening of abdominal pains  Swelling of the abdomen that is new, acute  Fever of 100F or higher  For urgent or emergent issues, a gastroenterologist can be reached at any hour by calling [phone].   DIET:  We do recommend a small meal at first, but then you may proceed to your regular diet.  Drink plenty of fluids but you should avoid alcoholic beverages for 24 hours.  ACTIVITY:  You should plan to take it easy for the rest of today and you should NOT DRIVE or use heavy machinery until tomorrow (because of the sedation medicines used during the test).     FOLLOW UP: Our staff will call the number listed on your records the next business day following your procedure to check on you and address any questions or concerns that you may have regarding the information given to you following your procedure. If we do not reach you, we will leave a message.  However, if you are feeling well and you are not experiencing any problems, there is no need to return our call.  We will assume that you have returned to your regular daily activities without incident.  If any biopsies were taken you will be contacted by phone or by letter within the next 1-3 weeks.  Please call us at [phone] if you have not heard about the biopsies in 3 weeks.    SIGNATURES/CONFIDENTIALITY: You and/or your care partner have signed paperwork which will be entered into your electronic medical record.  These signatures attest to the fact that that the information above on your After Visit Summary has been reviewed and is understood.  Full responsibility of the confidentiality of this discharge information lies with you and/or your care-partner. 

## 2017-05-02 NOTE — Op Note (Signed)
[facility] Patient Name: Francisco LeveringMatthew Mullen Procedure Date: 05/02/2017 10:05 AM MRN: [id number] Endoscopist: Rachael Feeaniel P  , MD Age: 46 Referring MD:  Date of Birth: [birth year] Gender: Male Account #: [id number] Procedure:                Colonoscopy Indications:              Screening patient at increased risk: Family history                            of 1st-degree relative with colorectal cancer at                            age 48 years (or older) Medicines:                Monitored Anesthesia Care Procedure:                Pre-Anesthesia Assessment:                           - Prior to the procedure, a History and Physical                            was performed, and patient medications and                            allergies were reviewed. The patient's tolerance of                            previous anesthesia was also reviewed. The risks                            and benefits of the procedure and the sedation                            options and risks were discussed with the patient.                            All questions were answered, and informed consent                            was obtained. Prior Anticoagulants: The patient has                            taken no previous anticoagulant or antiplatelet                            agents. ASA Grade Assessment: II - A patient with                            mild systemic disease. After reviewing the risks                            and benefits, the patient was deemed in  satisfactory condition to undergo the procedure.                           After obtaining informed consent, the colonoscope                            was passed under direct vision. Throughout the                            procedure, the patient's blood pressure, pulse, and                            oxygen saturations were monitored continuously. The                            Colonoscope was introduced through  the anus and                            advanced to the the cecum, identified by                            appendiceal orifice and ileocecal valve. The                            colonoscopy was performed without difficulty. The                            patient tolerated the procedure well. The quality                            of the bowel preparation was excellent. The                            terminal ileum and rectum were photographed. Scope In: 10:19:35 AM Scope Out: 10:30:04 AM Scope Withdrawal Time: 0 hours 8 minutes 55 seconds  Total Procedure Duration: 0 hours 10 minutes 29 seconds  Findings:                 Multiple small and large-mouthed diverticula were                            found in the left colon.                           Normal terminal ileum.                           The exam was otherwise without abnormality on                            direct and retroflexion views. Complications:            No immediate complications. Estimated blood loss:                            None. Estimated Blood Loss:  Estimated blood loss: none. Impression:               - Diverticulosis in the left colon.                           - The examination was otherwise normal on direct                            and retroflexion views.                           - No polyps or cancers Recommendation:           - Patient has a contact number available for                            emergencies. The signs and symptoms of potential                            delayed complications were discussed with the                            patient. Return to normal activities tomorrow.                            Written discharge instructions were provided to the                            patient.                           - Resume previous diet.                           - Continue present medications.                           - Repeat colonoscopy in 10 years for screening                             purposes. Rachael Fee, MD 05/02/2017 10:33:34 AM This report has been signed electronically.

## 2017-05-02 NOTE — Progress Notes (Signed)
Spontaneous respirations throughout. VSS. Resting comfortably. To PACU on room air. Report to  Suzanne RN. 

## 2017-05-02 NOTE — Progress Notes (Signed)
Patient was crying when he came out of the procedure room.  He was so relieved that he didn't have cancer like his Dad.   Abdomen is quite distended.   He was given levsin for the pain which was about an 8.   Zofran was given per order of Dr. Christella HartiganJacobs for relief of nausea.   The nausea subsided. The patient got up on all fours, turned from side to side and passed gas.   He then went to the holding area to have some coffee to stimulate the passing of more gas.  Dr. Christella HartiganJacobs did examine him, and he may go home when he's ready.

## 2017-05-03 ENCOUNTER — Telehealth: Payer: Self-pay | Admitting: *Deleted

## 2017-05-03 NOTE — Telephone Encounter (Signed)
No answer, left message to call if questions or concerns. 

## 2017-05-03 NOTE — Telephone Encounter (Signed)
  Follow up Call-  Call back number 05/02/2017  Post procedure Call Back phone  # [phone]  Permission to leave phone message Yes  Some recent data might be hidden     Patient questions:  Message left to call us if necessary.

## 2017-12-11 ENCOUNTER — Encounter: Payer: Self-pay | Admitting: Family Medicine

## 2017-12-12 ENCOUNTER — Telehealth: Payer: Self-pay

## 2017-12-12 MED ORDER — LISINOPRIL 10 MG PO TABS: ORAL_TABLET | ORAL | 0 refills | Status: DC

## 2017-12-12 NOTE — Telephone Encounter (Signed)
Refill sent to [facility] [address]. Pt notified.

## 2017-12-12 NOTE — Telephone Encounter (Signed)
Sent refill to [facility] [address].

## 2018-11-14 ENCOUNTER — Telehealth: Payer: Self-pay | Admitting: Family Medicine

## 2018-11-14 NOTE — Telephone Encounter (Signed)
E-scribed refill.  Please schedule annual CPE. 

## 2018-11-15 NOTE — Telephone Encounter (Signed)
Left message asking pt to call office  °

## 2018-12-03 NOTE — Telephone Encounter (Signed)
Left message asking pt to call office  °

## 2018-12-11 ENCOUNTER — Encounter: Payer: Self-pay | Admitting: Family Medicine

## 2018-12-11 NOTE — Telephone Encounter (Signed)
Noted  

## 2018-12-11 NOTE — Telephone Encounter (Signed)
Left message asking pt to call the office also  Mailed letter

## 2019-01-01 ENCOUNTER — Telehealth: Payer: Self-pay | Admitting: Family Medicine

## 2019-01-01 MED ORDER — LISINOPRIL 10 MG PO TABS: ORAL_TABLET | ORAL | 0 refills | Status: DC

## 2019-01-01 NOTE — Telephone Encounter (Signed)
Pt is requesting a refill for lisinopril. He is scheduled for his physical on 4/29. States he is completely out.

## 2019-01-01 NOTE — Telephone Encounter (Signed)
Noted.  E-scribed refill. 

## 2019-01-02 ENCOUNTER — Other Ambulatory Visit: Payer: Self-pay

## 2019-01-02 ENCOUNTER — Telehealth: Payer: Self-pay

## 2019-01-02 ENCOUNTER — Ambulatory Visit: Payer: BLUE CROSS/BLUE SHIELD | Admitting: Internal Medicine

## 2019-01-02 ENCOUNTER — Encounter: Payer: Self-pay | Admitting: Internal Medicine

## 2019-01-02 VITALS — BP 132/90 | HR 105 | Temp 98.1°F | Ht 70.0 in | Wt 228.0 lb

## 2019-01-02 DIAGNOSIS — I10 Essential (primary) hypertension: Secondary | ICD-10-CM | POA: Diagnosis not present

## 2019-01-02 LAB — COMPREHENSIVE METABOLIC PANEL
ALT: 36 U/L (ref 0–53)
AST: 23 U/L (ref 0–37)
Albumin: 4.6 g/dL (ref 3.5–5.2)
Alkaline Phosphatase: 67 U/L (ref 39–117)
BUN: 12 mg/dL (ref 6–23)
CO2: 26 meq/L (ref 19–32)
Calcium: 9.7 mg/dL (ref 8.4–10.5)
Chloride: 104 mEq/L (ref 96–112)
Creatinine, Ser: 1.03 mg/dL (ref 0.40–1.50)
GFR: 76.43 mL/min (ref >60.00)
Glucose, Bld: 90 mg/dL (ref 70–99)
Potassium: 4.2 mEq/L (ref 3.5–5.1)
Sodium: 137 mEq/L (ref 135–145)
Total Bilirubin: 0.5 mg/dL (ref 0.2–1.2)
Total Protein: 7.6 g/dL (ref 6.0–8.3)

## 2019-01-02 LAB — LIPID PANEL
Cholesterol: 208 mg/dL — ABNORMAL HIGH (ref 0–200)
HDL: 30.7 mg/dL — ABNORMAL LOW (ref >39.00)
NonHDL: 177.33
Total CHOL/HDL Ratio: 7
Triglycerides: 318 mg/dL — ABNORMAL HIGH (ref 0.0–149.0)
VLDL: 63.6 mg/dL — ABNORMAL HIGH (ref 0.0–40.0)

## 2019-01-02 LAB — CBC
HCT: 51.3 % (ref 39.0–52.0)
Hemoglobin: 17.8 g/dL — ABNORMAL HIGH (ref 13.0–17.0)
MCHC: 34.6 g/dL (ref 30.0–36.0)
MCV: 86.7 fl (ref 78.0–100.0)
Platelets: 215 10*3/uL (ref 150.0–400.0)
RBC: 5.92 Mil/uL — ABNORMAL HIGH (ref 4.22–5.81)
RDW: 12.9 % (ref 11.5–15.5)
WBC: 7 10*3/uL (ref 4.0–10.5)

## 2019-01-02 LAB — LDL CHOLESTEROL, DIRECT: Direct LDL: 139 mg/dL

## 2019-01-02 LAB — T4, FREE: Free T4: 0.81 ng/dL (ref 0.60–1.60)

## 2019-01-02 NOTE — Telephone Encounter (Signed)
Am going to see now
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
## 2019-01-02 NOTE — Telephone Encounter (Signed)
Pt said BP on 01/01/19 was 164/111 and this morning BP 170/106. Pt said he has pressure in his ears and "feels bad" with dizziness on and off and tingling feeling. Pt has no CP, no pain in jaw,neck or extremities,no SOB or H/A. Pt said under a lot of stress and is very anxious. Pt has been out of Lisinopril 10 mg for 2 weeks and just restarted Lisinopril 10 mg this morning at 7 AM. Pt said he does not want to sit and have a heart attack. Pt wants to know if should go to ED; reviewed symptoms and advised he has appt this morning with Dr Alphonsus Sias at 9:45 (pt could not wait for appt with Dr Reece Agar later this morning; Dr Sharen Hones does not come in today until 10 AM.) and pt advised if develops CP, SOB, pain in neck, jaw or arms prior to appt can go to ED. Pt said will get ready and come for appt with Dr Alphonsus Sias this AM. Lorain Childes to Dr Alphonsus Sias.

## 2019-01-02 NOTE — Patient Instructions (Signed)
DASH Eating Plan  DASH stands for "Dietary Approaches to Stop Hypertension." The DASH eating plan is a healthy eating plan that has been shown to reduce high blood pressure (hypertension). It may also reduce your risk for type 2 diabetes, heart disease, and stroke. The DASH eating plan may also help with weight loss.  What are tips for following this plan?    General guidelines   Avoid eating more than 2,300 mg (milligrams) of salt (sodium) a day. If you have hypertension, you may need to reduce your sodium intake to 1,500 mg a day.   Limit alcohol intake to no more than 1 drink a day for nonpregnant women and 2 drinks a day for men. One drink equals 12 oz of beer, 5 oz of wine, or 1 oz of hard liquor.   Work with your health care provider to maintain a healthy body weight or to lose weight. Ask what an ideal weight is for you.   Get at least 30 minutes of exercise that causes your heart to beat faster (aerobic exercise) most days of the week. Activities may include walking, swimming, or biking.   Work with your health care provider or diet and nutrition specialist (dietitian) to adjust your eating plan to your individual calorie needs.  Reading food labels     Check food labels for the amount of sodium per serving. Choose foods with less than 5 percent of the Daily Value of sodium. Generally, foods with less than 300 mg of sodium per serving fit into this eating plan.   To find whole grains, look for the word "whole" as the first word in the ingredient list.  Shopping   Buy products labeled as "low-sodium" or "no salt added."   Buy fresh foods. Avoid canned foods and premade or frozen meals.  Cooking   Avoid adding salt when cooking. Use salt-free seasonings or herbs instead of table salt or sea salt. Check with your health care provider or pharmacist before using salt substitutes.   Do not fry foods. Cook foods using healthy methods such as baking, boiling, grilling, and broiling instead.   Cook with  heart-healthy oils, such as olive, canola, soybean, or sunflower oil.  Meal planning   Eat a balanced diet that includes:  ? 5 or more servings of fruits and vegetables each day. At each meal, try to fill half of your plate with fruits and vegetables.  ? Up to 6-8 servings of whole grains each day.  ? Less than 6 oz of lean meat, poultry, or fish each day. A 3-oz serving of meat is about the same size as a deck of cards. One egg equals 1 oz.  ? 2 servings of low-fat dairy each day.  ? A serving of nuts, seeds, or beans 5 times each week.  ? Heart-healthy fats. Healthy fats called Omega-3 fatty acids are found in foods such as flaxseeds and coldwater fish, like sardines, salmon, and mackerel.   Limit how much you eat of the following:  ? Canned or prepackaged foods.  ? Food that is high in trans fat, such as fried foods.  ? Food that is high in saturated fat, such as fatty meat.  ? Sweets, desserts, sugary drinks, and other foods with added sugar.  ? Full-fat dairy products.   Do not salt foods before eating.   Try to eat at least 2 vegetarian meals each week.   Eat more home-cooked food and less restaurant, buffet, and fast food.     When eating at a restaurant, ask that your food be prepared with less salt or no salt, if possible.  What foods are recommended?  The items listed may not be a complete list. Talk with your dietitian about what dietary choices are best for you.  Grains  Whole-grain or whole-wheat bread. Whole-grain or whole-wheat pasta. Brown rice. Oatmeal. Quinoa. Bulgur. Whole-grain and low-sodium cereals. Pita bread. Low-fat, low-sodium crackers. Whole-wheat flour tortillas.  Vegetables  Fresh or frozen vegetables (raw, steamed, roasted, or grilled). Low-sodium or reduced-sodium tomato and vegetable juice. Low-sodium or reduced-sodium tomato sauce and tomato paste. Low-sodium or reduced-sodium canned vegetables.  Fruits  All fresh, dried, or frozen fruit. Canned fruit in natural juice (without  added sugar).  Meat and other protein foods  Skinless chicken or turkey. Ground chicken or turkey. Pork with fat trimmed off. Fish and seafood. Egg whites. Dried beans, peas, or lentils. Unsalted nuts, nut butters, and seeds. Unsalted canned beans. Lean cuts of beef with fat trimmed off. Low-sodium, lean deli meat.  Dairy  Low-fat (1%) or fat-free (skim) milk. Fat-free, low-fat, or reduced-fat cheeses. Nonfat, low-sodium ricotta or cottage cheese. Low-fat or nonfat yogurt. Low-fat, low-sodium cheese.  Fats and oils  Soft margarine without trans fats. Vegetable oil. Low-fat, reduced-fat, or light mayonnaise and salad dressings (reduced-sodium). Canola, safflower, olive, soybean, and sunflower oils. Avocado.  Seasoning and other foods  Herbs. Spices. Seasoning mixes without salt. Unsalted popcorn and pretzels. Fat-free sweets.  What foods are not recommended?  The items listed may not be a complete list. Talk with your dietitian about what dietary choices are best for you.  Grains  Baked goods made with fat, such as croissants, muffins, or some breads. Dry pasta or rice meal packs.  Vegetables  Creamed or fried vegetables. Vegetables in a cheese sauce. Regular canned vegetables (not low-sodium or reduced-sodium). Regular canned tomato sauce and paste (not low-sodium or reduced-sodium). Regular tomato and vegetable juice (not low-sodium or reduced-sodium). Pickles. Olives.  Fruits  Canned fruit in a light or heavy syrup. Fried fruit. Fruit in cream or butter sauce.  Meat and other protein foods  Fatty cuts of meat. Ribs. Fried meat. Bacon. Sausage. Bologna and other processed lunch meats. Salami. Fatback. Hotdogs. Bratwurst. Salted nuts and seeds. Canned beans with added salt. Canned or smoked fish. Whole eggs or egg yolks. Chicken or turkey with skin.  Dairy  Whole or 2% milk, cream, and half-and-half. Whole or full-fat cream cheese. Whole-fat or sweetened yogurt. Full-fat cheese. Nondairy creamers. Whipped toppings.  Processed cheese and cheese spreads.  Fats and oils  Butter. Stick margarine. Lard. Shortening. Ghee. Bacon fat. Tropical oils, such as coconut, palm kernel, or palm oil.  Seasoning and other foods  Salted popcorn and pretzels. Onion salt, garlic salt, seasoned salt, table salt, and sea salt. Worcestershire sauce. Tartar sauce. Barbecue sauce. Teriyaki sauce. Soy sauce, including reduced-sodium. Steak sauce. Canned and packaged gravies. Fish sauce. Oyster sauce. Cocktail sauce. Horseradish that you find on the shelf. Ketchup. Mustard. Meat flavorings and tenderizers. Bouillon cubes. Hot sauce and Tabasco sauce. Premade or packaged marinades. Premade or packaged taco seasonings. Relishes. Regular salad dressings.  Where to find more information:   National Heart, Lung, and Blood Institute: www.nhlbi.nih.gov   American Heart Association: www.heart.org  Summary   The DASH eating plan is a healthy eating plan that has been shown to reduce high blood pressure (hypertension). It may also reduce your risk for type 2 diabetes, heart disease, and stroke.   With the   DASH eating plan, you should limit salt (sodium) intake to 2,300 mg a day. If you have hypertension, you may need to reduce your sodium intake to 1,500 mg a day.   When on the DASH eating plan, aim to eat more fresh fruits and vegetables, whole grains, lean proteins, low-fat dairy, and heart-healthy fats.   Work with your health care provider or diet and nutrition specialist (dietitian) to adjust your eating plan to your individual calorie needs.  This information is not intended to replace advice given to you by your health care provider. Make sure you discuss any questions you have with your health care provider.  Document Released: 09/29/2011 Document Revised: 10/03/2016 Document Reviewed: 10/03/2016  Elsevier Interactive Patient Education  2019 [employer]

## 2019-01-02 NOTE — Assessment & Plan Note (Signed)
BP Readings from Last 3 Encounters:  01/02/19 132/90  05/02/17 119/84  02/21/17 (!) 126/102   Repeat 122/94 on right Discussed need to control BP for the long term Will continue the lisinopril 10mg  daily for now

## 2019-02-15 ENCOUNTER — Other Ambulatory Visit: Payer: BLUE CROSS/BLUE SHIELD

## 2019-02-20 ENCOUNTER — Encounter: Payer: BLUE CROSS/BLUE SHIELD | Admitting: Family Medicine

## 2019-04-15 ENCOUNTER — Encounter: Payer: Self-pay | Admitting: Family Medicine

## 2019-04-15 MED ORDER — LISINOPRIL 10 MG PO TABS: ORAL_TABLET | ORAL | 0 refills | Status: DC

## 2019-05-10 ENCOUNTER — Encounter: Payer: Self-pay | Admitting: Family Medicine

## 2019-05-10 NOTE — Telephone Encounter (Signed)
Will you plz r/s pt?

## 2019-05-10 NOTE — Telephone Encounter (Signed)
Left patient a voicemail to call us back and reschedule appointment

## 2019-05-16 ENCOUNTER — Other Ambulatory Visit: Payer: Self-pay | Admitting: Family Medicine

## 2019-05-16 ENCOUNTER — Other Ambulatory Visit: Payer: Self-pay

## 2019-05-16 ENCOUNTER — Other Ambulatory Visit: Payer: BC Managed Care – PPO

## 2019-05-16 DIAGNOSIS — D751 Secondary polycythemia: Secondary | ICD-10-CM

## 2019-05-16 DIAGNOSIS — E785 Hyperlipidemia, unspecified: Secondary | ICD-10-CM

## 2019-05-16 DIAGNOSIS — Z125 Encounter for screening for malignant neoplasm of prostate: Secondary | ICD-10-CM

## 2019-05-21 ENCOUNTER — Encounter: Payer: BLUE CROSS/BLUE SHIELD | Admitting: Family Medicine

## 2019-06-10 ENCOUNTER — Encounter: Payer: BC Managed Care – PPO | Admitting: Family Medicine

## 2019-09-25 ENCOUNTER — Encounter: Payer: Self-pay | Admitting: Emergency Medicine

## 2019-09-25 ENCOUNTER — Emergency Department
Admission: EM | Admit: 2019-09-25 | Discharge: 2019-09-25 | Disposition: A | Discharge status: Home / Self Care | Payer: BC Managed Care – PPO | Attending: Emergency Medicine | Admitting: Emergency Medicine

## 2019-09-25 ENCOUNTER — Emergency Department: Payer: BC Managed Care – PPO

## 2019-09-25 ENCOUNTER — Other Ambulatory Visit: Payer: Self-pay

## 2019-09-25 DIAGNOSIS — M25512 Pain in left shoulder: Secondary | ICD-10-CM | POA: Insufficient documentation

## 2019-09-25 DIAGNOSIS — Z87891 Personal history of nicotine dependence: Secondary | ICD-10-CM | POA: Insufficient documentation

## 2019-09-25 DIAGNOSIS — Z79899 Other long term (current) drug therapy: Secondary | ICD-10-CM | POA: Insufficient documentation

## 2019-09-25 DIAGNOSIS — I10 Essential (primary) hypertension: Secondary | ICD-10-CM | POA: Diagnosis not present

## 2019-09-25 DIAGNOSIS — F419 Anxiety disorder, unspecified: Secondary | ICD-10-CM | POA: Diagnosis not present

## 2019-09-25 DIAGNOSIS — R079 Chest pain, unspecified: Secondary | ICD-10-CM | POA: Diagnosis present

## 2019-09-25 LAB — CBC
HCT: 49.2 % (ref 39.0–52.0)
Hemoglobin: 17.7 g/dL — ABNORMAL HIGH (ref 13.0–17.0)
MCH: 30.3 pg (ref 26.0–34.0)
MCHC: 36 g/dL (ref 30.0–36.0)
MCV: 84.1 fL (ref 80.0–100.0)
Platelets: 245 10*3/uL (ref 150–400)
RBC: 5.85 MIL/uL — ABNORMAL HIGH (ref 4.22–5.81)
RDW: 12.9 % (ref 11.5–15.5)
WBC: 7.7 10*3/uL (ref 4.0–10.5)
nRBC: 0 % (ref 0.0–0.2)

## 2019-09-25 LAB — BASIC METABOLIC PANEL
Anion gap: 10 (ref 5–15)
BUN: 11 mg/dL (ref 6–20)
CO2: 23 mmol/L (ref 22–32)
Calcium: 9.3 mg/dL (ref 8.9–10.3)
Chloride: 107 mmol/L (ref 98–111)
Creatinine, Ser: 0.89 mg/dL (ref 0.61–1.24)
GFR calc Af Amer: >60 mL/min (ref >60)
GFR calc non Af Amer: >60 mL/min (ref >60)
Glucose, Bld: 111 mg/dL — ABNORMAL HIGH (ref 70–99)
Potassium: 3.5 mmol/L (ref 3.5–5.1)
Sodium: 140 mmol/L (ref 135–145)

## 2019-09-25 LAB — TROPONIN I (HIGH SENSITIVITY)
Troponin I (High Sensitivity): 2 ng/L (ref <18)
Troponin I (High Sensitivity): <2 ng/L (ref <18)

## 2019-09-25 MED ORDER — HYDROXYZINE HCL 25 MG PO TABS: 25.0000 mg | ORAL_TABLET | Freq: Three times a day (TID) | ORAL | 0 refills | Status: DC | PRN

## 2019-09-25 MED ORDER — SODIUM CHLORIDE 0.9% FLUSH: 3.0000 mL | Freq: Once | INTRAVENOUS | Status: DC

## 2019-09-25 MED ORDER — HYDROXYZINE HCL 25 MG PO TABS
50.0000 mg | ORAL_TABLET | Freq: Once | ORAL | Status: AC
  Administered 2019-09-25: 50 mg via ORAL
  Filled 2019-09-25: qty 2
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
## 2019-09-25 NOTE — ED Notes (Signed)
Patient ambulatory to lobby with steady gait and NAD noted. Verbalized understanding of discharge instructions and follow-up care.  

## 2019-09-25 NOTE — ED Notes (Signed)
Pt reports has been under a lot of stress working nights and staying up after until his wife gets home with the kids. Pt reports started having pain and pressure in his chest. Pt with hx of anxiety.

## 2019-09-25 NOTE — ED Triage Notes (Signed)
Chest pressure, " something just does not feel right" , feel like he going to pass out .  Under a lot of life stressors .   Chest pain is described as squeezing

## 2019-10-11 ENCOUNTER — Ambulatory Visit: Payer: BC Managed Care – PPO | Admitting: Family Medicine

## 2019-10-14 ENCOUNTER — Encounter: Payer: Self-pay | Admitting: Family Medicine

## 2019-10-14 ENCOUNTER — Ambulatory Visit: Payer: BC Managed Care – PPO | Admitting: Family Medicine

## 2019-10-14 ENCOUNTER — Other Ambulatory Visit: Payer: Self-pay

## 2019-10-14 VITALS — BP 118/82 | HR 107 | Temp 98.0°F | Ht 70.5 in | Wt 236.1 lb

## 2019-10-14 DIAGNOSIS — D751 Secondary polycythemia: Secondary | ICD-10-CM

## 2019-10-14 DIAGNOSIS — F410 Panic disorder [episodic paroxysmal anxiety] without agoraphobia: Secondary | ICD-10-CM

## 2019-10-14 DIAGNOSIS — M2550 Pain in unspecified joint: Secondary | ICD-10-CM

## 2019-10-14 DIAGNOSIS — E669 Obesity, unspecified: Secondary | ICD-10-CM

## 2019-10-14 DIAGNOSIS — I10 Essential (primary) hypertension: Secondary | ICD-10-CM

## 2019-10-14 DIAGNOSIS — G4700 Insomnia, unspecified: Secondary | ICD-10-CM

## 2019-10-14 DIAGNOSIS — F411 Generalized anxiety disorder: Secondary | ICD-10-CM

## 2019-10-14 MED ORDER — LISINOPRIL 10 MG PO TABS: ORAL_TABLET | ORAL | 3 refills | Status: DC

## 2019-10-14 MED ORDER — HYDROXYZINE HCL 10 MG PO TABS: 10.0000 mg | ORAL_TABLET | Freq: Two times a day (BID) | ORAL | 3 refills | Status: DC | PRN

## 2019-10-14 MED ORDER — TRAMADOL HCL 50 MG PO TABS: 25.0000 mg | ORAL_TABLET | Freq: Three times a day (TID) | ORAL | 0 refills | Status: AC | PRN

## 2019-10-14 MED ORDER — CITALOPRAM HYDROBROMIDE 10 MG PO TABS: 10.0000 mg | ORAL_TABLET | Freq: Every day | ORAL | 6 refills | Status: DC

## 2019-10-14 NOTE — Patient Instructions (Addendum)
Start celexa 10mg  daily. This will take 3-4 weeks to take effect. Use hydroxyzine 10mg  as needed while medicine builds.  Watch blood pressure and start lisinopril if BP creeping up.  Labs today.  Trial tramadol for breakthrough pain - use sparingly 1/2-1 tablet.  Consider trial of glucosamine (osteo bi-flex) daily for a month.  Sleep questionairre handout provided today.

## 2019-10-14 NOTE — Progress Notes (Signed)
This visit was conducted in person.  BP 118/82 (BP Location: Left Arm, Patient Position: Sitting, Cuff Size: Large)   Pulse (!) 107   Temp 98 F (36.7 C) (Temporal)   Ht 5' 10.5" (1.791 m)   Wt 236 lb 1 oz (107.1 kg)   SpO2 96%   BMI 33.39 kg/m   148/100 on repeat  CC: anxiety f/u Subjective:    Patient ID: Francisco Mullen, male    DOB: 1971-09-12, 48 y.o.   MRN: [id number]  HPI: Francisco Mullen is a 48 y.o. male presenting on 10/14/2019 for Anxiety (Here for f/u. ) and Medication Refill (Needs new rx for EpiPen.)   Recent ER visit for uneasy feeling associated with L arm pains. High stress recently, poor sleep (3-4 hours/night). Discharged on hydroxyzine PRN anxiety.   Noticing worsening anxiety attacks - without clear triggers. BP elevation, heart rate speeding up to 120-130, mild pressure in chest., nausea.   Weight gain noted.  Polycythemia noted at ER.  Has cut down on soft drinks. Avoiding fast food.   Caffeine - drinks 2 diet pepsi/day.   New job has him traveling a lot.   Also noticing increasing joint pain - in longstanding h/o polyarthralgias (mainly knees, feet, lower back).  Fingers, elbows, toes and knees, feet, ankles, back, neck. No shoulder pain. No redness, swelling, warmth. Can wake up feeling arthralgias, can get better as day progresses. Tylenol/motrin ineffective for joint pains.   Ran out of lisinopril about a week ago, BP staying well controlled. He's been managing with CBD 75m. Notes BP very related to diet (high sodium or caffeine).   Snoring but no witnessed apnea.   Mother with sjogren's.      Relevant past medical, surgical, family and social history reviewed and updated as indicated. Interim medical history since our last visit reviewed. Allergies and medications reviewed and updated. Outpatient Medications Prior to Visit  Medication Sig Dispense Refill  . acetaminophen (TYLENOL) 500 MG tablet Take 1,000 mg by mouth every 6 (six) hours  as needed.    . Ascorbic Acid (VITAMIN C) 1000 MG tablet Take 1,000 mg by mouth daily.    .Marland Kitchenb complex vitamins tablet Take 1 tablet by mouth daily.    . Multiple Vitamin (MULTIVITAMIN) tablet Take 1 tablet by mouth daily.    .Marland KitchenEPINEPHrine (EPI-PEN) 0.3 mg/0.3 mL DEVI Inject 0.3 mLs (0.3 mg total) into the muscle once. 1 Device 1  . hydrOXYzine (ATARAX/VISTARIL) 25 MG tablet Take 1 tablet (25 mg total) by mouth 3 (three) times daily as needed for anxiety. 20 tablet 0  . lisinopril (ZESTRIL) 10 MG tablet TAKE 1 TABLET BY MOUTH ONCE DAILY 90 tablet 0  . LORazepam (ATIVAN) 1 MG tablet Take 0.5-1 tablets (0.5-1 mg total) by mouth daily as needed for anxiety (plane ride). (Patient not taking: Reported on 10/14/2019) 4 tablet 0  . ibuprofen (ADVIL,MOTRIN) 800 MG tablet TAKE ONE TABLET BY MOUTH EVERY 8 HOURS AS NEEDED FOR MODERATE  PAIN (Patient not taking: Reported on 10/14/2019) 30 tablet 3   No facility-administered medications prior to visit.     Per HPI unless specifically indicated in ROS section below Review of Systems Objective:    BP 118/82 (BP Location: Left Arm, Patient Position: Sitting, Cuff Size: Large)   Pulse (!) 107   Temp 98 F (36.7 C) (Temporal)   Ht 5' 10.5" (1.791 m)   Wt 236 lb 1 oz (107.1 kg)   SpO2 96%  BMI 33.39 kg/m   Wt Readings from Last 3 Encounters:  10/14/19 236 lb 1 oz (107.1 kg)  09/25/19 230 lb (104.3 kg)  01/02/19 228 lb (103.4 kg)    Physical Exam Vitals and nursing note reviewed.  Constitutional:      General: He is not in acute distress.    Appearance: Normal appearance. He is obese. He is not ill-appearing.  Eyes:     Extraocular Movements: Extraocular movements intact.     Conjunctiva/sclera: Conjunctivae normal.     Pupils: Pupils are equal, round, and reactive to light.  Neck:     Thyroid: No thyromegaly or thyroid tenderness.  Cardiovascular:     Rate and Rhythm: Normal rate and regular rhythm.     Pulses: Normal pulses.     Heart  sounds: Normal heart sounds. No murmur.  Pulmonary:     Effort: Pulmonary effort is normal. No respiratory distress.     Breath sounds: Normal breath sounds. No wheezing, rhonchi or rales.  Musculoskeletal:     Right lower leg: No edema.     Left lower leg: No edema.  Skin:    Findings: No rash.  Neurological:     Mental Status: He is alert.  Psychiatric:        Mood and Affect: Mood normal.        Behavior: Behavior normal.       Results for orders placed or performed during the hospital encounter of 77/41/42  Basic metabolic panel  Result Value Ref Range   Sodium 140 135 - 145 mmol/L   Potassium 3.5 3.5 - 5.1 mmol/L   Chloride 107 98 - 111 mmol/L   CO2 23 22 - 32 mmol/L   Glucose, Bld 111 (H) 70 - 99 mg/dL   BUN 11 6 - 20 mg/dL   Creatinine, Ser 0.89 0.61 - 1.24 mg/dL   Calcium 9.3 8.9 - 10.3 mg/dL   GFR calc non Af Amer >60 >60 mL/min   GFR calc Af Amer >60 >60 mL/min   Anion gap 10 5 - 15  CBC  Result Value Ref Range   WBC 7.7 4.0 - 10.5 K/uL   RBC 5.85 (H) 4.22 - 5.81 MIL/uL   Hemoglobin 17.7 (H) 13.0 - 17.0 g/dL   HCT 49.2 39.0 - 52.0 %   MCV 84.1 80.0 - 100.0 fL   MCH 30.3 26.0 - 34.0 pg   MCHC 36.0 30.0 - 36.0 g/dL   RDW 12.9 11.5 - 15.5 %   Platelets 245 150 - 400 K/uL   nRBC 0.0 0.0 - 0.2 %  Troponin I (High Sensitivity)  Result Value Ref Range   Troponin I (High Sensitivity) 2 <18 ng/L  Troponin I (High Sensitivity)  Result Value Ref Range   Troponin I (High Sensitivity) <2 <18 ng/L   No flowsheet data found.  GAD 7 : Generalized Anxiety Score 10/14/2019  Nervous, Anxious, on Edge 3  Control/stop worrying 3  Worry too much - different things 3  Trouble relaxing 3  Restless 1  Easily annoyed or irritable 3  Afraid - awful might happen 1  Total GAD 7 Score 17    Assessment & Plan:  This visit occurred during the SARS-CoV-2 public health emergency.  Safety protocols were in place, including screening questions prior to the visit, additional  usage of staff PPE, and extensive cleaning of exam room while observing appropriate contact time as indicated for disinfecting solutions.   Problem List Items Addressed This Visit  Polycythemia    Noted this year (also 12/2018). ?OSA related - consider sending for testing.       Relevant Orders   Ambulatory referral to Pulmonology   Polyarthralgia    Longstanding. No signs of active synovitis but does describe inflammatory arthritis symptoms of AM pain/stiffness. fmhx autoimmunity. Check labs today including ESR, ANA. Continue tylenol/advil (limited benefit), provide Rx for sparing tramadol for breakthrough pain with controlled substance precautions.      Relevant Orders   Sedimentation rate   ANA   vit d   Obesity, Class I, BMI 30-34.9    Reviewed weight in relation to polyarthralgias       Relevant Orders   Ambulatory referral to Pulmonology   Insomnia    Longstanding. Hydroxyzine has been helpful.  + snoring but no witnessed apneic episodes.  Discussed possible sleep medicine eval with noted polycythemia.  ESS = 7.      Relevant Orders   Ambulatory referral to Pulmonology   Hypertension    BP stable off lisinopril. On repeat, BP elevated. I have refilled lisinopril and he should start medication if home readings elevated.       Relevant Medications   lisinopril (ZESTRIL) 10 MG tablet   EPINEPHrine 0.3 mg/0.3 mL IJ SOAJ injection   GAD (generalized anxiety disorder)    Start celexa - see above.       Relevant Medications   hydrOXYzine (ATARAX/VISTARIL) 10 MG tablet   citalopram (CELEXA) 10 MG tablet   Anxiety attack - Primary    Describes anxiety attacks with high GAD7 questionairre. Reviewed stress relieving strategies including medication treatment. Given ongoing daily symptoms discussed daily medication along with PRN medication. He feels hydroxyzine may have been more effective than lorazepam but is overly sedating. Will send low dose hydroxyzine 6m along with  celexa 118mdaily - reviewed common side effects to watch for. RTC 2 wks f/u visit for CPE as due. Check labwork for other causes of anxiety symptoms (CBC, TSH).       Relevant Medications   hydrOXYzine (ATARAX/VISTARIL) 10 MG tablet   citalopram (CELEXA) 10 MG tablet   Other Relevant Orders   TSH       Meds ordered this encounter  Medications  . lisinopril (ZESTRIL) 10 MG tablet    Sig: TAKE 1 TABLET BY MOUTH ONCE DAILY    Dispense:  90 tablet    Refill:  3  . hydrOXYzine (ATARAX/VISTARIL) 10 MG tablet    Sig: Take 1 tablet (10 mg total) by mouth 2 (two) times daily as needed for anxiety.    Dispense:  30 tablet    Refill:  3  . citalopram (CELEXA) 10 MG tablet    Sig: Take 1 tablet (10 mg total) by mouth daily.    Dispense:  30 tablet    Refill:  6  . traMADol (ULTRAM) 50 MG tablet    Sig: Take 0.5-1 tablets (25-50 mg total) by mouth 3 (three) times daily as needed for up to 5 days for moderate pain.    Dispense:  15 tablet    Refill:  0  . EPINEPHrine 0.3 mg/0.3 mL IJ SOAJ injection    Sig: Inject 0.3 mLs (0.3 mg total) into the muscle as needed for anaphylaxis.    Dispense:  2 each    Refill:  1   Orders Placed This Encounter  Procedures  . TSH  . Sedimentation rate  . ANA  . vit d  . Ambulatory referral to  Pulmonology    Referral Priority:   Routine    Referral Type:   Consultation    Referral Reason:   Specialty Services Required    Requested Specialty:   Pulmonary Disease    Number of Visits Requested:   1   Patient Instructions  Start celexa 41m daily. This will take 3-4 weeks to take effect. Use hydroxyzine 164mas needed while medicine builds.  Watch blood pressure and start lisinopril if BP creeping up.  Labs today.  Trial tramadol for breakthrough pain - use sparingly 1/2-1 tablet.  Consider trial of glucosamine (osteo bi-flex) daily for a month.  Sleep questionairre handout provided today.    Follow up plan: Return in about 2 weeks (around  10/28/2019) for annual exam, prior fasting for blood work.  JaRia BushMD

## 2019-10-15 ENCOUNTER — Other Ambulatory Visit (INDEPENDENT_AMBULATORY_CARE_PROVIDER_SITE_OTHER): Payer: BC Managed Care – PPO

## 2019-10-15 DIAGNOSIS — D751 Secondary polycythemia: Secondary | ICD-10-CM | POA: Insufficient documentation

## 2019-10-15 DIAGNOSIS — F410 Panic disorder [episodic paroxysmal anxiety] without agoraphobia: Secondary | ICD-10-CM | POA: Insufficient documentation

## 2019-10-15 DIAGNOSIS — M2550 Pain in unspecified joint: Secondary | ICD-10-CM

## 2019-10-15 DIAGNOSIS — L4050 Arthropathic psoriasis, unspecified: Secondary | ICD-10-CM | POA: Insufficient documentation

## 2019-10-15 LAB — SEDIMENTATION RATE: Sed Rate: 18 mm/hr (ref 0–20)

## 2019-10-15 LAB — TSH: TSH: 1.91 u[IU]/mL (ref 0.35–4.50)

## 2019-10-15 LAB — URIC ACID: Uric Acid, Serum: 6.9 mg/dL (ref 4.0–7.8)

## 2019-10-15 LAB — VITAMIN D 25 HYDROXY (VIT D DEFICIENCY, FRACTURES): VITD: 30.75 ng/mL (ref 30.00–100.00)

## 2019-10-15 MED ORDER — EPINEPHRINE 0.3 MG/0.3ML IJ SOAJ: 0.3000 mg | INTRAMUSCULAR | 1 refills | Status: AC | PRN

## 2019-10-15 NOTE — Assessment & Plan Note (Signed)
Longstanding. Hydroxyzine has been helpful.  + snoring but no witnessed apneic episodes.  Discussed possible sleep medicine eval with noted polycythemia.  ESS = 7.

## 2019-10-15 NOTE — Assessment & Plan Note (Signed)
Reviewed weight in relation to polyarthralgias

## 2019-10-15 NOTE — Assessment & Plan Note (Signed)
BP stable off lisinopril. On repeat, BP elevated. I have refilled lisinopril and he should start medication if home readings elevated.

## 2019-10-15 NOTE — Assessment & Plan Note (Signed)
Describes anxiety attacks with high GAD7 questionairre. Reviewed stress relieving strategies including medication treatment. Given ongoing daily symptoms discussed daily medication along with PRN medication. He feels hydroxyzine may have been more effective than lorazepam but is overly sedating. Will send low dose hydroxyzine 10mg  along with celexa 10mg  daily - reviewed common side effects to watch for. RTC 2 wks f/u visit for CPE as due. Check labwork for other causes of anxiety symptoms (CBC, TSH).

## 2019-10-15 NOTE — Assessment & Plan Note (Signed)
Longstanding. No signs of active synovitis but does describe inflammatory arthritis symptoms of AM pain/stiffness. fmhx autoimmunity. Check labs today including ESR, ANA. Continue tylenol/advil (limited benefit), provide Rx for sparing tramadol for breakthrough pain with controlled substance precautions.

## 2019-10-15 NOTE — Assessment & Plan Note (Signed)
Noted this year (also 12/2018). ?OSA related - consider sending for testing.

## 2019-10-15 NOTE — Assessment & Plan Note (Signed)
Start celexa - see above.

## 2019-10-16 ENCOUNTER — Telehealth: Payer: Self-pay

## 2019-10-16 LAB — ANTI-NUCLEAR AB-TITER (ANA TITER): ANA Titer 1: 1:80 {titer} — ABNORMAL HIGH

## 2019-10-16 LAB — ANA: Anti Nuclear Antibody (ANA): POSITIVE — AB

## 2019-10-16 NOTE — Telephone Encounter (Signed)
LVM to call clinic, pt needs  COVID screen, front door and back lab in fo 12.23.2020 TLJ

## 2019-10-22 ENCOUNTER — Other Ambulatory Visit: Payer: Self-pay

## 2019-10-22 ENCOUNTER — Other Ambulatory Visit: Payer: BC Managed Care – PPO

## 2019-10-23 ENCOUNTER — Other Ambulatory Visit: Payer: Self-pay

## 2019-10-23 ENCOUNTER — Encounter: Payer: Self-pay | Admitting: Family Medicine

## 2019-10-23 ENCOUNTER — Ambulatory Visit (INDEPENDENT_AMBULATORY_CARE_PROVIDER_SITE_OTHER): Payer: BC Managed Care – PPO | Admitting: Family Medicine

## 2019-10-23 VITALS — BP 122/80 | HR 105 | Temp 98.5°F | Ht 68.0 in | Wt 238.4 lb

## 2019-10-23 DIAGNOSIS — R0683 Snoring: Secondary | ICD-10-CM

## 2019-10-23 DIAGNOSIS — E785 Hyperlipidemia, unspecified: Secondary | ICD-10-CM

## 2019-10-23 DIAGNOSIS — M545 Low back pain, unspecified: Secondary | ICD-10-CM

## 2019-10-23 DIAGNOSIS — F411 Generalized anxiety disorder: Secondary | ICD-10-CM

## 2019-10-23 DIAGNOSIS — G8929 Other chronic pain: Secondary | ICD-10-CM

## 2019-10-23 DIAGNOSIS — I10 Essential (primary) hypertension: Secondary | ICD-10-CM

## 2019-10-23 DIAGNOSIS — Z0000 Encounter for general adult medical examination without abnormal findings: Secondary | ICD-10-CM

## 2019-10-23 DIAGNOSIS — M2550 Pain in unspecified joint: Secondary | ICD-10-CM

## 2019-10-23 DIAGNOSIS — Z125 Encounter for screening for malignant neoplasm of prostate: Secondary | ICD-10-CM

## 2019-10-23 DIAGNOSIS — F410 Panic disorder [episodic paroxysmal anxiety] without agoraphobia: Secondary | ICD-10-CM

## 2019-10-23 MED ORDER — VITAMIN D3 25 MCG (1000 UT) PO CAPS: 1.0000 | ORAL_CAPSULE | Freq: Every day | ORAL | Status: AC

## 2019-10-23 MED ORDER — TRAMADOL HCL 50 MG PO TABS: 25.0000 mg | ORAL_TABLET | Freq: Two times a day (BID) | ORAL | 0 refills | Status: DC | PRN

## 2019-10-23 NOTE — Patient Instructions (Addendum)
Return tomorrow morning for fasting labs.  Look into mediterranean diet below  Work on anti inflammatory diet (high in fruits/vegetables) Start vitamin D3 1000 units daily  We will refer yo to rheumatology for further evaluation.       Mediterranean Diet  Why follow it? Research shows. . Those who follow the Mediterranean diet have a reduced risk of heart disease  . The diet is associated with a reduced incidence of Parkinson's and Alzheimer's diseases . People following the diet may have longer life expectancies and lower rates of chronic diseases  . The Dietary Guidelines for Americans recommends the Mediterranean diet as an eating plan to promote health and prevent disease  What Is the Mediterranean Diet?  . Healthy eating plan based on typical foods and recipes of Mediterranean-style cooking . The diet is primarily a plant based diet; these foods should make up a majority of meals   Starches - Plant based foods should make up a majority of meals - They are an important sources of vitamins, minerals, energy, antioxidants, and fiber - Choose whole grains, foods high in fiber and minimally processed items  - Typical grain sources include wheat, oats, barley, corn, brown rice, bulgar, farro, millet, polenta, couscous  - Various types of beans include chickpeas, lentils, fava beans, black beans, white beans   Fruits  Veggies - Large quantities of antioxidant rich fruits & veggies; 6 or more servings  - Vegetables can be eaten raw or lightly drizzled with oil and cooked  - Vegetables common to the traditional Mediterranean Diet include: artichokes, arugula, beets, broccoli, brussel sprouts, cabbage, carrots, celery, collard greens, cucumbers, eggplant, kale, leeks, lemons, lettuce, mushrooms, okra, onions, peas, peppers, potatoes, pumpkin, radishes, rutabaga, shallots, spinach, sweet potatoes, turnips, zucchini - Fruits common to the Mediterranean Diet include: apples, apricots, avocados,  cherries, clementines, dates, figs, grapefruits, grapes, melons, nectarines, oranges, peaches, pears, pomegranates, strawberries, tangerines  Fats - Replace butter and margarine with healthy oils, such as olive oil, canola oil, and tahini  - Limit nuts to no more than a handful a day  - Nuts include walnuts, almonds, pecans, pistachios, pine nuts  - Limit or avoid candied, honey roasted or heavily salted nuts - Olives are central to the [employer] - can be eaten whole or used in a variety of dishes   Meats Protein - Limiting red meat: no more than a few times a month - When eating red meat: choose lean cuts and keep the portion to the size of deck of cards - Eggs: approx. 0 to 4 times a week  - Fish and lean poultry: at least 2 a week  - Healthy protein sources include, chicken, [place], lean beef, lamb - Increase intake of seafood such as tuna, salmon, trout, mackerel, shrimp, scallops - Avoid or limit high fat processed meats such as sausage and bacon  Dairy - Include moderate amounts of low fat dairy products  - Focus on healthy dairy such as fat free yogurt, skim milk, low or reduced fat cheese - Limit dairy products higher in fat such as whole or 2% milk, cheese, ice cream  Alcohol - Moderate amounts of red wine is ok  - No more than 5 oz daily for women (all ages) and men older than age 66  - No more than 10 oz of wine daily for men younger than 33  Other - Limit sweets and other desserts  - Use herbs and spices instead of salt to flavor foods  -  Herbs and spices common to the traditional Mediterranean Diet include: basil, bay leaves, chives, cloves, cumin, fennel, garlic, lavender, marjoram, mint, oregano, parsley, pepper, rosemary, sage, savory, sumac, tarragon, thyme   It's not just a diet, it's a lifestyle:  . The Mediterranean diet includes lifestyle factors typical of those in the region  . Foods, drinks and meals are best eaten with others and savored . Daily physical  activity is important for overall good health . This could be strenuous exercise like running and aerobics . This could also be more leisurely activities such as walking, housework, yard-work, or taking the stairs . Moderation is the key; a balanced and healthy diet accommodates most foods and drinks . Consider portion sizes and frequency of consumption of certain foods   Meal Ideas & Options:  . Breakfast:  o Whole wheat toast or whole wheat English muffins with peanut butter & hard boiled egg o Steel cut oats topped with apples & cinnamon and skim milk  o Fresh fruit: banana, strawberries, melon, berries, peaches  o Smoothies: strawberries, bananas, greek yogurt, peanut butter o Low fat greek yogurt with blueberries and granola  o Egg white omelet with spinach and mushrooms o Breakfast couscous: whole wheat couscous, apricots, skim milk, cranberries  . Sandwiches:  o Hummus and grilled vegetables (peppers, zucchini, squash) on whole wheat bread   o Grilled chicken on whole wheat pita with lettuce, tomatoes, cucumbers or tzatziki  o Tuna salad on whole wheat bread: tuna salad made with greek yogurt, olives, red peppers, capers, green onions o Garlic rosemary lamb pita: lamb sauted with garlic, rosemary, salt & pepper; add lettuce, cucumber, greek yogurt to pita - flavor with lemon juice and black pepper  . Seafood:  o Mediterranean grilled salmon, seasoned with garlic, basil, parsley, lemon juice and black pepper o Shrimp, lemon, and spinach whole-grain pasta salad made with low fat greek yogurt  o Seared scallops with lemon orzo  o Seared tuna steaks seasoned salt, pepper, coriander topped with tomato mixture of olives, tomatoes, olive oil, minced garlic, parsley, green onions and cappers  . Meats:  o Herbed greek chicken salad with kalamata olives, cucumber, feta  o Red bell peppers stuffed with spinach, bulgur, lean ground beef (or lentils) & topped with feta   o Kebabs: skewers of  chicken, tomatoes, onions, zucchini, squash  o [place] burgers: made with red onions, mint, dill, lemon juice, feta cheese topped with roasted red peppers . Vegetarian o Cucumber salad: cucumbers, artichoke hearts, celery, red onion, feta cheese, tossed in olive oil & lemon juice  o Hummus and whole grain pita points with a greek salad (lettuce, tomato, feta, olives, cucumbers, red onion) o Lentil soup with celery, carrots made with vegetable broth, garlic, salt and pepper  o Tabouli salad: parsley, bulgur, mint, scallions, cucumbers, tomato, radishes, lemon juice, olive oil, salt and pepper.

## 2019-10-23 NOTE — Progress Notes (Signed)
This visit was conducted in person.  BP 122/80 (BP Location: Left Arm, Patient Position: Sitting, Cuff Size: Large)   Pulse (!) 105   Temp 98.5 F (36.9 C) (Temporal)   Ht 5' 8"  (1.727 m)   Wt 238 lb 6 oz (108.1 kg)   SpO2 98%   BMI 36.24 kg/m    CC: CPE Subjective:    Patient ID: Francisco Mullen, male    DOB: 1971-10-21, 48 y.o.   MRN: [id number]  HPI: Francisco Mullen is a 48 y.o. male presenting on 10/23/2019 for Annual Exam (Pt agrees to rheum referral. ) and Medication Refill (Requests refill for tramadol. )   See prior note for details. Last visit we started celexa 64m daily - this caused chest discomfort after 3 days so he stopped.   Longstanding polyarthralgia - ANA elevated but low titers. Given episodes of diffuse polyarthralgias that last for days, agrees to rheum referral. He notes he can develop rough rash (ie on elbows) prior to episodes. Latest episode was this weekend with marked hand pain and stiffness without redness or swelling. Tramadol did help significantly, requests refilled.   Wife endorses worse snoring depending on fatigue. No witnessed apneic episodes. ESS last visit = 7. Noted polycythyemia.   Preventative: COLONOSCOPY 04/2017 - diverticulosis, rpt 10 yrs (Francisco Mullen. He does have fmhx colon cancer in father.  Prostate cancer screening - discussed, will check PSA, no nocturia.  Lung cancer screening - not eligible Flu shot - declines Tdap 2012 Hep A/B 2015 - completed course  Seat belt use discussed Sunscreen use discussed. No changing moles on skin.  Ex smoker - quit 2007 - rare cigarette PRN anxiety Alcohol - occasional, 2-3 beers or shots per sitting Dentist yearly  Eye exam last seen a couple years ago  Caffeine: diet drinks Lives with wife (Francisco Mullen, 2 sons and daughter, 1 dog Occupation: IT nEducation officer, community Edu: college  Activity: no regular exercise  Diet: diet drinks, more water, fruits/vegetables daily      Relevant past medical,  surgical, family and social history reviewed and updated as indicated. Interim medical history since our last visit reviewed. Allergies and medications reviewed and updated. Outpatient Medications Prior to Visit  Medication Sig Dispense Refill  . acetaminophen (TYLENOL) 500 MG tablet Take 1,000 mg by mouth every 6 (six) hours as needed.    . Ascorbic Acid (VITAMIN C) 1000 MG tablet Take 1,000 mg by mouth daily.    .Marland Kitchenb complex vitamins tablet Take 1 tablet by mouth daily.    .Marland KitchenEPINEPHrine 0.3 mg/0.3 mL IJ SOAJ injection Inject 0.3 mLs (0.3 mg total) into the muscle as needed for anaphylaxis. 2 each 1  . hydrOXYzine (ATARAX/VISTARIL) 10 MG tablet Take 1 tablet (10 mg total) by mouth 2 (two) times daily as needed for anxiety. 30 tablet 3  . lisinopril (ZESTRIL) 10 MG tablet TAKE 1 TABLET BY MOUTH ONCE DAILY 90 tablet 3  . LORazepam (ATIVAN) 1 MG tablet Take 0.5-1 tablets (0.5-1 mg total) by mouth daily as needed for anxiety (plane ride). 4 tablet   . Multiple Vitamin (MULTIVITAMIN) tablet Take 1 tablet by mouth daily.    .Marland KitchenLORazepam (ATIVAN) 1 MG tablet Take 0.5-1 tablets (0.5-1 mg total) by mouth daily as needed for anxiety (plane ride). 4 tablet 0  . citalopram (CELEXA) 10 MG tablet Take 1 tablet (10 mg total) by mouth daily. (Patient not taking: Reported on 10/23/2019) 30 tablet 6   No facility-administered medications prior  to visit.     Per HPI unless specifically indicated in ROS section below Review of Systems  Constitutional: Negative for activity change, appetite change, chills, fatigue, fever and unexpected weight change.  HENT: Negative for hearing loss.   Eyes: Negative for visual disturbance.  Respiratory: Positive for chest tightness. Negative for cough, shortness of breath and wheezing.   Cardiovascular: Negative for chest pain, palpitations and leg swelling.  Gastrointestinal: Negative for abdominal distention, abdominal pain, blood in stool, constipation, diarrhea, nausea and  vomiting.  Genitourinary: Negative for difficulty urinating and hematuria.  Musculoskeletal: Positive for arthralgias. Negative for myalgias and neck pain.  Skin: Negative for rash.  Neurological: Negative for dizziness, seizures, syncope and headaches.  Hematological: Negative for adenopathy. Does not bruise/bleed easily.  Psychiatric/Behavioral: Negative for dysphoric mood. The patient is nervous/anxious.    Objective:    BP 122/80 (BP Location: Left Arm, Patient Position: Sitting, Cuff Size: Large)   Pulse (!) 105   Temp 98.5 F (36.9 C) (Temporal)   Ht 5' 8"  (1.727 m)   Wt 238 lb 6 oz (108.1 kg)   SpO2 98%   BMI 36.24 kg/m   Wt Readings from Last 3 Encounters:  10/23/19 238 lb 6 oz (108.1 kg)  10/14/19 236 lb 1 oz (107.1 kg)  09/25/19 230 lb (104.3 kg)    Physical Exam Vitals and nursing note reviewed.  Constitutional:      General: He is not in acute distress.    Appearance: Normal appearance. He is well-developed. He is obese. He is not ill-appearing.  HENT:     Head: Normocephalic and atraumatic.     Right Ear: Hearing, tympanic membrane, ear canal and external ear normal.     Left Ear: Hearing, tympanic membrane, ear canal and external ear normal.     Mouth/Throat:     Pharynx: Uvula midline.  Eyes:     General: No scleral icterus.    Extraocular Movements: Extraocular movements intact.     Conjunctiva/sclera: Conjunctivae normal.     Pupils: Pupils are equal, round, and reactive to light.  Cardiovascular:     Rate and Rhythm: Normal rate and regular rhythm.     Pulses: Normal pulses.          Radial pulses are 2+ on the right side and 2+ on the left side.     Heart sounds: Normal heart sounds. No murmur.  Pulmonary:     Effort: Pulmonary effort is normal. No respiratory distress.     Breath sounds: Normal breath sounds. No wheezing, rhonchi or rales.  Abdominal:     General: Abdomen is flat. Bowel sounds are normal. There is no distension.     Palpations:  Abdomen is soft. There is no mass.     Tenderness: There is no abdominal tenderness. There is no guarding or rebound.     Hernia: No hernia is present.  Musculoskeletal:        General: Normal range of motion.     Cervical back: Normal range of motion and neck supple.     Right lower leg: No edema.     Left lower leg: No edema.  Lymphadenopathy:     Cervical: No cervical adenopathy.  Skin:    General: Skin is warm and dry.     Findings: No rash.  Neurological:     General: No focal deficit present.     Mental Status: He is alert and oriented to person, place, and time.  Comments: CN grossly intact, station and gait intact  Psychiatric:        Mood and Affect: Mood normal.        Behavior: Behavior normal.        Thought Content: Thought content normal.        Judgment: Judgment normal.       Results for orders placed or performed in visit on 10/15/19  Uric acid  Result Value Ref Range   Uric Acid, Serum 6.9 4.0 - 7.8 mg/dL   Assessment & Plan:  This visit occurred during the SARS-CoV-2 public health emergency.  Safety protocols were in place, including screening questions prior to the visit, additional usage of staff PPE, and extensive cleaning of exam room while observing appropriate contact time as indicated for disinfecting solutions.   Problem List Items Addressed This Visit    Snoring    Consider OSA eval given ESS = 7 and snoring endorsed by wife.       Severe obesity (BMI 35.0-39.9) with comorbidity ([place])    Encouraged ongoing efforts at healthy diet and active lifestyle. He is considering transitioning to largely vegetarian diet. Discussed effect of pro-inflammatory foods on joint pains.       Polyarthralgia    Chronic, intermittent episodes of diffuse polyarthralgias and stiffness that last days. Tramadol effective - will refill. Mildly positive ANA recently, but ESR was normal. Reasonable for rheum evaluation to help r/o inflammatory arthritis. Discussed  starting vit D supplementation and following anti inflammatory diet (more fruits/vegetables, avoiding red meat and packaged processed carbs).       Relevant Orders   Ambulatory referral to Rheumatology   Lower back pain   Relevant Medications   traMADol (ULTRAM) 50 MG tablet   Hypertension    Chronic, stable off medication at this time. He is managing with CBD oil.      HLD (hyperlipidemia)    Update FLP when he returns fasting tomorrow.        Relevant Orders   Lipid panel   Health maintenance examination - Primary    Preventative protocols reviewed and updated unless pt declined. Discussed healthy diet and lifestyle.       GAD (generalized anxiety disorder)    Continue PRN hydroxyzine.      Relevant Medications   LORazepam (ATIVAN) 1 MG tablet   Anxiety attack    Doing better with PRN low dose hydroxyzine 36m. celexa may have caused chest discomfort - will stay off this for now.       Relevant Medications   LORazepam (ATIVAN) 1 MG tablet    Other Visit Diagnoses    Special screening for malignant neoplasm of prostate       Relevant Orders   PSA       Meds ordered this encounter  Medications  . Cholecalciferol (VITAMIN D3) 25 MCG (1000 UT) CAPS    Sig: Take 1 capsule (1,000 Units total) by mouth daily.    Dispense:  30 capsule  . traMADol (ULTRAM) 50 MG tablet    Sig: Take 0.5-1 tablets (25-50 mg total) by mouth 2 (two) times daily as needed for moderate pain.    Dispense:  30 tablet    Refill:  0    For chronic pain   Orders Placed This Encounter  Procedures  . Lipid panel    Standing Status:   Future    Number of Occurrences:   1    Standing Expiration Date:   10/22/2020  . PSA  Standing Status:   Future    Number of Occurrences:   1    Standing Expiration Date:   10/22/2020  . Ambulatory referral to Rheumatology    Referral Priority:   Routine    Referral Type:   Consultation    Referral Reason:   Specialty Services Required    Requested  Specialty:   Rheumatology    Number of Visits Requested:   1    Patient instructions: Return tomorrow morning for fasting labs.  Look into mediterranean diet below  Work on anti inflammatory diet (high in fruits/vegetables) Start vitamin D3 1000 units daily  We will refer yo to rheumatology for further evaluation.   Follow up plan: No follow-ups on file.  Ria Bush, MD

## 2019-10-23 NOTE — Assessment & Plan Note (Signed)
Preventative protocols reviewed and updated unless pt declined. Discussed healthy diet and lifestyle.  

## 2019-10-24 ENCOUNTER — Other Ambulatory Visit (INDEPENDENT_AMBULATORY_CARE_PROVIDER_SITE_OTHER): Payer: BC Managed Care – PPO

## 2019-10-24 DIAGNOSIS — Z125 Encounter for screening for malignant neoplasm of prostate: Secondary | ICD-10-CM

## 2019-10-24 DIAGNOSIS — E785 Hyperlipidemia, unspecified: Secondary | ICD-10-CM | POA: Diagnosis not present

## 2019-10-24 DIAGNOSIS — R0683 Snoring: Secondary | ICD-10-CM | POA: Insufficient documentation

## 2019-10-24 LAB — LIPID PANEL
Cholesterol: 196 mg/dL (ref 0–200)
HDL: 28 mg/dL — ABNORMAL LOW (ref >39.00)
NonHDL: 168.16
Total CHOL/HDL Ratio: 7
Triglycerides: 338 mg/dL — ABNORMAL HIGH (ref 0.0–149.0)
VLDL: 67.6 mg/dL — ABNORMAL HIGH (ref 0.0–40.0)

## 2019-10-24 LAB — PSA: PSA: 0.37 ng/mL (ref 0.10–4.00)

## 2019-10-24 LAB — LDL CHOLESTEROL, DIRECT: Direct LDL: 123 mg/dL

## 2019-10-24 NOTE — Assessment & Plan Note (Signed)
Doing better with PRN low dose hydroxyzine 10mg . celexa may have caused chest discomfort - will stay off this for now.

## 2019-10-24 NOTE — Assessment & Plan Note (Signed)
Chronic, stable off medication at this time. He is managing with CBD oil.

## 2019-10-24 NOTE — Assessment & Plan Note (Signed)
Chronic, intermittent episodes of diffuse polyarthralgias and stiffness that last days. Tramadol effective - will refill. Mildly positive ANA recently, but ESR was normal. Reasonable for rheum evaluation to help r/o inflammatory arthritis. Discussed starting vit D supplementation and following anti inflammatory diet (more fruits/vegetables, avoiding red meat and packaged processed carbs).

## 2019-10-24 NOTE — Assessment & Plan Note (Addendum)
Update FLP when he returns fasting tomorrow.

## 2019-10-24 NOTE — Assessment & Plan Note (Signed)
Encouraged ongoing efforts at healthy diet and active lifestyle. He is considering transitioning to largely vegetarian diet. Discussed effect of pro-inflammatory foods on joint pains.

## 2019-10-24 NOTE — Assessment & Plan Note (Signed)
Continue PRN hydroxyzine.  

## 2019-10-24 NOTE — Assessment & Plan Note (Signed)
Consider OSA eval given ESS = 7 and snoring endorsed by wife.

## 2019-10-28 ENCOUNTER — Encounter: Payer: Self-pay | Admitting: Family Medicine

## 2019-11-01 ENCOUNTER — Institutional Professional Consult (permissible substitution): Payer: BC Managed Care – PPO | Admitting: Pulmonary Disease

## 2019-11-19 ENCOUNTER — Encounter: Payer: Self-pay | Admitting: Family Medicine

## 2019-11-19 NOTE — Telephone Encounter (Signed)
Name of Medication: Tramadol Name of Pharmacy: [facility] Last Fill or Written Date and Quantity: 10/23/19, #30 Last Office Visit and Type: 10/23/19, CPE Next Office Visit and Type: none Last Controlled Substance Agreement Date: 10/21/14 Last UDS: 10/21/14

## 2019-11-22 MED ORDER — TRAMADOL HCL 50 MG PO TABS: 25.0000 mg | ORAL_TABLET | Freq: Two times a day (BID) | ORAL | 0 refills | Status: DC | PRN

## 2019-11-22 NOTE — Telephone Encounter (Signed)
[facility]x 

## 2020-02-07 ENCOUNTER — Encounter: Payer: Self-pay | Admitting: Family Medicine

## 2020-02-07 MED ORDER — HYDROXYZINE HCL 10 MG PO TABS: 10.0000 mg | ORAL_TABLET | Freq: Two times a day (BID) | ORAL | 3 refills | Status: DC | PRN

## 2020-02-07 NOTE — Telephone Encounter (Signed)
Last fill: 10/23/19, #4 for plane ride Last OV:10/23/19 Next OV: pt will schedule

## 2020-02-07 NOTE — Telephone Encounter (Signed)
I believe he is requesting hydroxyzine. This was refilled.

## 2020-02-10 ENCOUNTER — Other Ambulatory Visit: Payer: Self-pay

## 2020-02-10 ENCOUNTER — Encounter: Payer: Self-pay | Admitting: Family Medicine

## 2020-02-10 ENCOUNTER — Ambulatory Visit: Payer: BC Managed Care – PPO | Admitting: Family Medicine

## 2020-02-10 VITALS — BP 144/100 | HR 105 | Temp 96.7°F | Ht 68.0 in | Wt 244.5 lb

## 2020-02-10 DIAGNOSIS — L4050 Arthropathic psoriasis, unspecified: Secondary | ICD-10-CM

## 2020-02-10 DIAGNOSIS — I10 Essential (primary) hypertension: Secondary | ICD-10-CM

## 2020-02-10 DIAGNOSIS — M1990 Unspecified osteoarthritis, unspecified site: Secondary | ICD-10-CM | POA: Diagnosis not present

## 2020-02-10 DIAGNOSIS — F419 Anxiety disorder, unspecified: Secondary | ICD-10-CM | POA: Diagnosis not present

## 2020-02-10 MED ORDER — TRAMADOL HCL 50 MG PO TABS: 50.0000 mg | ORAL_TABLET | Freq: Two times a day (BID) | ORAL | 0 refills | Status: DC | PRN

## 2020-02-10 NOTE — Assessment & Plan Note (Signed)
Acute worsening hypertension in setting of prednisone use, new MTX use. Not controlled with lisinopril 5mg  - will increase to 10mg  dose.

## 2020-02-10 NOTE — Patient Instructions (Addendum)
Start lisinopril 31m daily as blood pressures are staying elevated.  Ok to use tramadol as needed for pain.  Call to schedule f/u appointment with [facility].  Work on avoiding inflammatory foods (red meats, sodas, processed/packaged foods, refined sugars).   Psoriatic Arthritis Psoriatic arthritis is a long-term (chronic) condition that causes pain, swelling, and stiffness in the joints. The large joints of the legs, hips, and pelvis are most often affected. The joints in the neck and back may also be affected. Sometimes psoriatic arthritis can involve joints in the toes, fingers, wrists, and elbows. Most people with psoriatic arthritis have a chronic skin disease that causes itchy scales and patches to form on the skin (psoriasis) before they develop psoriatic arthritis. In some cases, a person may have psoriatic arthritis before or without having psoriasis. Psoriatic arthritis can be mild or severe. It may come and go or cause symptoms all the time. It may affect one joint, a few joints, or many joints. In severe cases, untreated psoriatic arthritis can cause joint damage. What are the causes? The exact cause of this condition is not known. Psoriatic arthritis is an autoimmune disease. With this type of disease, the body's defense system (immune system) mistakenly attacks healthy tissues. If you have psoriasis, the immune system attacks the skin. With psoriatic arthritis, the immune system attacks joints and the tissues that connect muscles to joints (tendons). The disease may be activated by a trigger, such as an infection or stress. What increases the risk? You are more likely to develop this condition if:  You have psoriasis. This is the biggest risk factor. Most people who develop psoriatic arthritis have had psoriasis for 5 to 10 years.  You have a family history of psoriasis or psoriatic arthritis.  You are between the ages of 89and 89 What are the signs or symptoms? The main symptom of  this condition is inflammation of joints and tendons. Other symptoms may include:  Joint swelling.  Joint pain.  Joint stiffness, especially in the morning.  Swollen fingers and toes.  Pain in areas where tendons connect to bones.  Pain in the heel or sole of the foot.  Pitted and weak nails.  Tiredness (fatigue).  Eye redness. How is this diagnosed? This condition may be diagnosed based on:  Your symptoms and medical history.  A physical exam.  X-rays to look for joint inflammation or damage, especially in the joints of the pelvis (sacroiliac joints).  Other imaging tests, such as a CT scan or MRI.  Blood tests to look for inflammation and to rule out other causes of joint inflammation, such as gout or rheumatoid arthritis. How is this treated? The goal of treatment is to reduce pain and inflammation and protect joints from damage. Treatment depends on how severe the inflammation is and how many joints are affected. Treatment may include:  Medicines, such as: ? NSAIDs to relieve pain and inflammation. For people with mild disease, these may be the only medicines needed. ? Disease-modifying antirheumatic drugs (DMARDs). ? Biologic medicines. These may be used for severe inflammation or if other medicines are not working. These medicines are usually given as injections or through an IV. They are very effective for many people with inflammatory arthritis, but there is an increased risk of infection.  Physical therapy and other exercise to strengthen muscles that support joints and to prevent joint stiffness.  A brace or splint to support a painful and swollen joint.  Surgery to reconstruct or replace a  joint. This may be an option for people with severe joint damage if other treatments have not helped. Follow these instructions at home: Medicines  Take over-the-counter and prescription medicines only as told by your health care provider.  Be aware of the possible side  effects of your medicine and know when to call your health care provider.  If you are taking a biologic, let your health care provider know if you have signs or symptoms of an infection. You may need to stop treatment until your infection clears up. Managing pain, stiffness, and swelling   If directed, put ice on painful areas. ? Put ice in a plastic bag. ? Place a towel between your skin and the bag. ? Leave the ice on for 20 minutes, 2-3 times a day. If you have a brace or splint:  Wear the brace or splint as told by your health care provider. Remove it only as told by your health care provider.  Loosen the brace or splint if your fingers or toes tingle, become numb, or turn cold and blue.  Keep the brace or splint clean.  If the brace or splint is not waterproof: ? Do not let it get wet. ? Cover it with a watertight covering when you take a bath or shower. Activity  Return to your normal activities as told by your health care provider. Ask your health care provider what activities are safe for you.  Get regular exercise. Ask your health care provider what type of exercise is best for you. Your health care provider may recommend: ? Low-impact exercises such as walking, biking, or swimming. ? Exercises that include stretching, such as tai chi and yoga.  Do not exercise when you have a flare of symptoms. Rest until the symptoms improve. Eating and drinking   Do not drink alcohol if you are taking an NSAID. Alcohol and NSAIDs can cause stomach irritation.  Eat a healthy diet that includes plenty of vegetables, fruits, whole grains, low-fat dairy products, and lean protein. Do not eat a lot of foods that are high in solid fats, added sugars, or salt. General instructions  Do not use any products that contain nicotine or tobacco, such as cigarettes, e-cigarettes, and chewing tobacco. These can make arthritis worse. If you need help quitting, ask your health care  provider.  Maintain a healthy weight. A healthy weight will help you stay active and take stress off your joints.  Stay up to date on all immunizations, including the yearly (annual) flu vaccine.  Keep all follow-up visits as told by your health care provider. This is important. Where to find more information  American Academy of Dermatology: http://jones-macias.info/  American College of Rheumatology: www.rheumatology.[place]: www.psoriasis.org Contact a health care provider if:  Your signs and symptoms flare up.  You have side effects from your medicines.  You are taking a biologic and you have a fever or other signs of infection, such as: ? Chills. ? Feeling tired. ? Cough or sore throat. ? Loss of appetite. Summary  Psoriatic arthritis is an autoimmune disease that causes joint pain, swelling, and stiffness.  Most people with psoriatic arthritis have the skin disease called psoriasis first.  You may have imaging tests and blood tests to help your health care provider diagnose this condition.  The goal of treatment is to reduce pain and inflammation and protect joints from damage.  Medicines can relieve symptoms and prevent joint damage. This information is not intended to replace  advice given to you by your health care provider. Make sure you discuss any questions you have with your health care provider. Document Revised: 02/05/2019 Document Reviewed: 06/14/2018 Elsevier Patient Education  2020 [employer].

## 2020-02-10 NOTE — Assessment & Plan Note (Addendum)
Rheum eval ([facility]) 11/2019 - thought psoriatic arthritis s/p prednisone taper and MTX - did not tolerate MTX. Now off both. Ongoing pains. Pending return to rheum will Rx tramadol for arthritis flares. Reviewed PRN dosing. Pt agrees with plan. I did ask him to call for appt in with rheum for later this year.  Reviewed healthy anti inflammatory diet - encouraged backing off dark sodas, avoiding refined sugars and red meats.

## 2020-02-10 NOTE — Assessment & Plan Note (Signed)
Diffuse body pains lead to increased anxiety.  Continue PRN hydroxyzine.  Did not tolerate celexa well (chest discomfort).

## 2020-02-10 NOTE — Progress Notes (Addendum)
This visit was conducted in person.  BP (!) 144/100   Pulse (!) 105   Temp (!) 96.7 F (35.9 C) (Temporal)   Ht 5\' 8"  (1.727 m)   Wt 244 lb 8 oz (110.9 kg)   SpO2 98%   BMI 37.18 kg/m   BP Readings from Last 3 Encounters:  02/10/20 (!) 144/100  10/23/19 122/80  10/14/19 118/82  142/108 on repeat  CC: BP f/u Subjective:    Patient ID: 10/[id number], male    DOB: 07-08-1971, 49 y.o.   MRN: [id number]  HPI: Francisco Mullen is a 49 y.o. male presenting on 02/10/2020 for Blood Pressure   Here with wife today.  See prior note and MyChart message for details.  Recently given prednisone.   Recently saw rheum for polyarthralgia - psoriatic arthritis treated with 2 wk prednisone taper then MTX once weekly along with folic acid. MTX may have caused rash. He stopped MTX 2 wks ago - with improvement of rash.   Prednisone was stopped 2 months ago.  Weight gain noted.  Working on 02/12/2020.   Ongoing joint pains, diffuse body aches.  Pending new rheumatologist at [facility] as Dr [facility] may be leaving.       Relevant past medical, surgical, family and social history reviewed and updated as indicated. Interim medical history since our last visit reviewed. Allergies and medications reviewed and updated. Outpatient Medications Prior to Visit  Medication Sig Dispense Refill  . acetaminophen (TYLENOL) 500 MG tablet Take 1,000 mg by mouth every 6 (six) hours as needed.    . Ascorbic Acid (VITAMIN C) 1000 MG tablet Take 1,000 mg by mouth daily.    Jaci Lazier b complex vitamins tablet Take 1 tablet by mouth daily.    . Cholecalciferol (VITAMIN D3) 25 MCG (1000 UT) CAPS Take 1 capsule (1,000 Units total) by mouth daily. 30 capsule   . EPINEPHrine 0.3 mg/0.3 mL IJ SOAJ injection Inject 0.3 mLs (0.3 mg total) into the muscle as needed for anaphylaxis. 2 each 1  . hydrOXYzine (ATARAX/VISTARIL) 10 MG tablet Take 1 tablet (10 mg total) by mouth 2 (two) times daily as needed for anxiety. 30  tablet 3  . lisinopril (ZESTRIL) 10 MG tablet TAKE 1 TABLET BY MOUTH ONCE DAILY (Patient taking differently: Take 5 mg by mouth daily. TAKE 1 TABLET BY MOUTH ONCE DAILY) 90 tablet 3  . LORazepam (ATIVAN) 1 MG tablet Take 0.5-1 tablets (0.5-1 mg total) by mouth daily as needed for anxiety (plane ride). 4 tablet   . Multiple Vitamin (MULTIVITAMIN) tablet Take 1 tablet by mouth daily.    . traMADol (ULTRAM) 50 MG tablet Take 0.5-1 tablets (25-50 mg total) by mouth 2 (two) times daily as needed for moderate pain. 30 tablet 0  . methotrexate 2.5 MG tablet Take 15 mg by mouth once a week.    . predniSONE (DELTASONE) 10 MG tablet Take 10 mg by mouth daily with breakfast.      No facility-administered medications prior to visit.     Per HPI unless specifically indicated in ROS section below Review of Systems Objective:    BP (!) 144/100   Pulse (!) 105   Temp (!) 96.7 F (35.9 C) (Temporal)   Ht 5\' 8"  (1.727 m)   Wt 244 lb 8 oz (110.9 kg)   SpO2 98%   BMI 37.18 kg/m   Wt Readings from Last 3 Encounters:  02/10/20 244 lb 8 oz (110.9 kg)  10/23/19  238 lb 6 oz (108.1 kg)  10/14/19 236 lb 1 oz (107.1 kg)    Physical Exam Vitals and nursing note reviewed.  Constitutional:      Appearance: Normal appearance. He is not ill-appearing.  Eyes:     Extraocular Movements: Extraocular movements intact.     Pupils: Pupils are equal, round, and reactive to light.  Cardiovascular:     Rate and Rhythm: Normal rate and regular rhythm.     Pulses: Normal pulses.     Heart sounds: Normal heart sounds. No murmur.  Pulmonary:     Effort: Pulmonary effort is normal. No respiratory distress.     Breath sounds: Normal breath sounds. No wheezing, rhonchi or rales.  Musculoskeletal:        General: Swelling and tenderness present.     Right lower leg: No edema.     Left lower leg: No edema.  Skin:    General: Skin is warm and dry.     Findings: Rash (residual rough papules on bilateral palms) present.  No erythema.  Neurological:     Mental Status: He is alert.  Psychiatric:        Mood and Affect: Mood normal.        Behavior: Behavior normal.       Results for orders placed or performed in visit on 10/24/19  PSA  Result Value Ref Range   PSA 0.37 0.10 - 4.00 ng/mL  Lipid panel  Result Value Ref Range   Cholesterol 196 0 - 200 mg/dL   Triglycerides 409.8 (H) 0.0 - 149.0 mg/dL   HDL 11.91 (L) >47.82 mg/dL   VLDL 95.6 (H) 0.0 - 21.3 mg/dL   Total CHOL/HDL Ratio 7    NonHDL 168.16   LDL cholesterol, direct  Result Value Ref Range   Direct LDL 123.0 mg/dL   Assessment & Plan:  This visit occurred during the SARS-CoV-2 public health emergency.  Safety protocols were in place, including screening questions prior to the visit, additional usage of staff PPE, and extensive cleaning of exam room while observing appropriate contact time as indicated for disinfecting solutions.   Problem List Items Addressed This Visit    Psoriatic arthritis (HCC) - Primary    Rheum eval ([facility]) 11/2019 - thought psoriatic arthritis s/p prednisone taper and MTX - did not tolerate MTX. Now off both. Ongoing pains. Pending return to rheum will Rx tramadol for arthritis flares. Reviewed PRN dosing. Pt agrees with plan. I did ask him to call for appt in with rheum for later this year.  Reviewed healthy anti inflammatory diet - encouraged backing off dark sodas, avoiding refined sugars and red meats.       Relevant Medications   traMADol (ULTRAM) 50 MG tablet   Other Relevant Orders   RNP Antibody   Anti-DNA antibody, double-stranded   Hypertension    Acute worsening hypertension in setting of prednisone use, new MTX use. Not controlled with lisinopril 5mg  - will increase to 10mg  dose.       Anxiety    Diffuse body pains lead to increased anxiety.  Continue PRN hydroxyzine.  Did not tolerate celexa well (chest discomfort).        Other Visit Diagnoses    Inflammatory arthritis        Relevant Medications   traMADol (ULTRAM) 50 MG tablet   Other Relevant Orders   RNP Antibody   Anti-DNA antibody, double-stranded       Meds ordered this encounter  Medications  .  traMADol (ULTRAM) 50 MG tablet    Sig: Take 1 tablet (50 mg total) by mouth 2 (two) times daily as needed for moderate pain.    Dispense:  30 tablet    Refill:  0    For chronic pain   Orders Placed This Encounter  Procedures  . RNP Antibody  . Anti-DNA antibody, double-stranded    Patient instructions: Start lisinopril 10mg  daily as blood pressures are staying elevated.  Ok to use tramadol as needed for pain.  Call to schedule f/u appointment with [facility] rheum.  Work on avoiding inflammatory foods (red meats, sodas, processed/packaged foods, refined sugars).   Follow up plan: No follow-ups on file.  Ria Bush, MD

## 2020-02-11 LAB — ANTI-DNA ANTIBODY, DOUBLE-STRANDED: ds DNA Ab: 2 IU/mL

## 2020-02-11 LAB — RNP ANTIBODY: Ribonucleic Protein(ENA) Antibody, IgG: <1 AI

## 2020-03-28 ENCOUNTER — Encounter: Payer: Self-pay | Admitting: Family Medicine

## 2020-03-30 NOTE — Telephone Encounter (Signed)
Name of Medication: Tramadol, Lorazepam Name of Pharmacy: [facility] Last Fill or Written Date and Quantity:       Tramadol- 02/10/20, #30      Lorazepam- 10/23/19, #4 Last Office Visit and Type: 02/10/20, HTN & GAD f/u Next Office Visit and Type: none Last Controlled Substance Agreement Date: 10/21/14 Last UDS: 10/21/14  Hydroxyzine last rx:  02/07/20, #30/3

## 2020-03-31 MED ORDER — TRAMADOL HCL 50 MG PO TABS: 50.0000 mg | ORAL_TABLET | Freq: Two times a day (BID) | ORAL | 0 refills | Status: DC | PRN

## 2020-03-31 MED ORDER — HYDROXYZINE HCL 10 MG PO TABS: 10.0000 mg | ORAL_TABLET | Freq: Two times a day (BID) | ORAL | 3 refills | Status: DC | PRN

## 2020-03-31 MED ORDER — LISINOPRIL 10 MG PO TABS: ORAL_TABLET | ORAL | 1 refills | Status: DC

## 2021-01-11 ENCOUNTER — Telehealth: Payer: Self-pay | Admitting: Family Medicine

## 2021-01-12 NOTE — Telephone Encounter (Signed)
Left vm for the patient to call our office (no details left) needs to schedule cpe and labs. EM Will re attempt to call the patient

## 2021-01-12 NOTE — Telephone Encounter (Signed)
Please call patient and schedule annual physical. 

## 2021-01-14 ENCOUNTER — Encounter: Payer: Self-pay | Admitting: Family Medicine

## 2021-01-14 NOTE — Telephone Encounter (Signed)
Called second time . Will mail letter. EM

## 2021-04-09 ENCOUNTER — Other Ambulatory Visit: Payer: Self-pay

## 2021-04-09 ENCOUNTER — Encounter: Payer: Self-pay | Admitting: Family Medicine

## 2021-04-09 ENCOUNTER — Ambulatory Visit (INDEPENDENT_AMBULATORY_CARE_PROVIDER_SITE_OTHER): Payer: BC Managed Care – PPO | Admitting: Family Medicine

## 2021-04-09 VITALS — BP 136/82 | HR 92 | Temp 97.5°F | Ht 68.0 in | Wt 217.2 lb

## 2021-04-09 DIAGNOSIS — F410 Panic disorder [episodic paroxysmal anxiety] without agoraphobia: Secondary | ICD-10-CM

## 2021-04-09 DIAGNOSIS — F419 Anxiety disorder, unspecified: Secondary | ICD-10-CM

## 2021-04-09 DIAGNOSIS — E669 Obesity, unspecified: Secondary | ICD-10-CM | POA: Diagnosis not present

## 2021-04-09 DIAGNOSIS — I10 Essential (primary) hypertension: Secondary | ICD-10-CM

## 2021-04-09 DIAGNOSIS — L4050 Arthropathic psoriasis, unspecified: Secondary | ICD-10-CM

## 2021-04-09 MED ORDER — DIAZEPAM 5 MG PO TABS: 2.5000 mg | ORAL_TABLET | Freq: Two times a day (BID) | ORAL | 0 refills | Status: AC | PRN

## 2021-04-09 MED ORDER — METOPROLOL TARTRATE 25 MG PO TABS: 25.0000 mg | ORAL_TABLET | Freq: Two times a day (BID) | ORAL | 3 refills | Status: DC | PRN

## 2021-04-09 NOTE — Progress Notes (Signed)
Patient ID: Francisco Mullen, male    DOB: 1971/05/08, 50 y.o.   MRN: [id number]  This visit was conducted in person.  BP 136/82   Pulse 92   Temp (!) 97.5 F (36.4 C) (Temporal)   Ht 5\' 8"  (1.727 m)   Wt 217 lb 3 oz (98.5 kg)   SpO2 98%   BMI 33.02 kg/m    CC: HTN, anxiety Subjective:   HPI: Francisco Mullen is a 50 y.o. male presenting on 04/09/2021 for Follow-up (Here for HTN and anxiety f/u.)   Last seen 01/2020.  Established with rheumatologist Dr 02/2020 04/2020 with dx psoriatic arthritis, started on celebrex 100mg  bid and Enbrel. He was unable to tolerate these - Enbrel caused body aches, nausea, diarrhea, celebrex caused same trouble. Previously unable to tolerate methotrexate due to malaise or prednisone due to weight gain. Trouble tolerating tramadol due to chest pain. Continues tylenol, motrin PRN. Has not returned for rheum f/u.   Managing arthritis with diet changes - avoids wheat containing food, as well as additives/preservatives, sugars or other inflammatory substances as able. Avoids soft drinks. Drinking Zevia drinks or unsweet tea for energy from caffeine.   Overall joint pains controlled with healthy diet however does get intermittent flares of joint pains which can be associated with anxiety attacks and elevated blood pressures. Takes hydroxyzine 10mg  with benefit (25mg  dose was overly sedating).  Yesterday while in acute flare BP 168/101, HR 92. He does not regularly take lisinopril - only when BP rises with arthritis flares.  This morning - 128/80, HR 70.   Work and family stressors continue - planning to change jobs but anticipates family stressors will continue.  He continues walking nightly with his wife with benefit.      Relevant past medical, surgical, family and social history reviewed and updated as indicated. Interim medical history since our last visit reviewed. Allergies and medications reviewed and updated. Outpatient Medications Prior to Visit   Medication Sig Dispense Refill   acetaminophen (TYLENOL) 500 MG tablet Take 1,000 mg by mouth every 6 (six) hours as needed.     Ascorbic Acid (VITAMIN C) 1000 MG tablet Take 1,000 mg by mouth daily.     b complex vitamins tablet Take 1 tablet by mouth daily.     Cholecalciferol (VITAMIN D3) 25 MCG (1000 UT) CAPS Take 1 capsule (1,000 Units total) by mouth daily. 30 capsule    EPINEPHrine 0.3 mg/0.3 mL IJ SOAJ injection Inject 0.3 mLs (0.3 mg total) into the muscle as needed for anaphylaxis. 2 each 1   hydrOXYzine (ATARAX/VISTARIL) 10 MG tablet Take 1 tablet (10 mg total) by mouth 2 (two) times daily as needed for anxiety. 30 tablet 3   LORazepam (ATIVAN) 1 MG tablet Take 0.5-1 tablets (0.5-1 mg total) by mouth daily as needed for anxiety (plane ride). 4 tablet    Multiple Vitamin (MULTIVITAMIN) tablet Take 1 tablet by mouth daily.     lisinopril (ZESTRIL) 10 MG tablet Take 1 tablet by mouth once daily 90 tablet 0   traMADol (ULTRAM) 50 MG tablet Take 1 tablet (50 mg total) by mouth 2 (two) times daily as needed for moderate pain. 30 tablet 0   No facility-administered medications prior to visit.     Per HPI unless specifically indicated in ROS section below Review of Systems Objective:  BP 136/82   Pulse 92   Temp (!) 97.5 F (36.4 C) (Temporal)   Ht 5\' 8"  (1.727 m)  Wt 217 lb 3 oz (98.5 kg)   SpO2 98%   BMI 33.02 kg/m   Wt Readings from Last 3 Encounters:  04/09/21 217 lb 3 oz (98.5 kg)  02/10/20 244 lb 8 oz (110.9 kg)  10/23/19 238 lb 6 oz (108.1 kg)      Physical Exam Vitals and nursing note reviewed.  Constitutional:      Appearance: Normal appearance. He is not ill-appearing.  Neurological:     Mental Status: He is alert.  Psychiatric:        Mood and Affect: Mood normal.        Behavior: Behavior normal.      Results for orders placed or performed in visit on 02/10/20  RNP Antibody  Result Value Ref Range   Ribonucleic Protein(ENA) Antibody, IgG <1.0 NEG <1.0  NEG AI  Anti-DNA antibody, double-stranded  Result Value Ref Range   ds DNA Ab 2 IU/mL   Assessment & Plan:  This visit occurred during the SARS-CoV-2 public health emergency.  Safety protocols were in place, including screening questions prior to the visit, additional usage of staff PPE, and extensive cleaning of exam room while observing appropriate contact time as indicated for disinfecting solutions.   Encouraged return for CPE - has scheduled for 05/2021 Problem List Items Addressed This Visit     Obesity, Class I, BMI 30-34.9    Congratulated on weight loss to date - he's managed with diet changes.  He wants to avoid prednisone given marked weight gain while on it.        Hypertension    Seems largely situational related to arthritis flares - will stop ACEI, trial metoprolol 25mg  BID PRN BP >150/100 when in acute arthritis flare. I did ask him to continue monitoring home blood pressures, let me know if consistently >140/90       Relevant Medications   metoprolol tartrate (LOPRESSOR) 25 MG tablet   Anxiety - Primary    Situational anxiety when he gets psoriatic arthritis flare, as well as related to high family and work stressors.  He desires to avoid daily medication if possible.  Discussed managing with PRN hydroxyzine which he currently does, will also try out valium PRN hopeful for some muscle relaxant effect. Reviewed controlled substance nature with increased regulations, as well as possible habit forming nature of medication, dependence/tolerance, and addiction potential.  Encouraged ongoing healthy stress relieving strategies. Update with effect.        Relevant Medications   diazepam (VALIUM) 5 MG tablet   Psoriatic arthritis (HCC)    Established with rheum Dr .  Has been unable to tolerate treatments to date. Managing with diet changes.  Encouraged rheum f/u to further discuss other possible options.        Anxiety attack    See above: continue hydroxyzine  PRN trial valium PRN.        Relevant Medications   diazepam (VALIUM) 5 MG tablet     Meds ordered this encounter  Medications   metoprolol tartrate (LOPRESSOR) 25 MG tablet    Sig: Take 1 tablet (25 mg total) by mouth 2 (two) times daily as needed (blood pressure >160, HR > 100).    Dispense:  60 tablet    Refill:  3   diazepam (VALIUM) 5 MG tablet    Sig: Take 0.5-1 tablets (2.5-5 mg total) by mouth every 12 (twelve) hours as needed for anxiety (sedation precautions).    Dispense:  30 tablet    Refill:  0    No orders of the defined types were placed in this encounter.   Patient Instructions  Return to see rheum for psoriatic arthritis follow up.  May use metoprolol 25mg  twice daily as needed for blood pressure, increased heart rate in setting of joint pain flares.  May use diazepam longer acting benzodiazepine as needed for stress/anxiety. Don't take and drive. Start at 1/2 tablet.  Continue monitoring blood pressures.  Keep updated with how you're doing.  Keep physical for August.   Follow up plan: Return in about 3 months (around 07/10/2021), or if symptoms worsen or fail to improve, for annual exam, prior fasting for blood work.  07/12/2021, MD

## 2021-04-09 NOTE — Patient Instructions (Addendum)
Return to see rheum for psoriatic arthritis follow up.  May use metoprolol 25mg  twice daily as needed for blood pressure, increased heart rate in setting of joint pain flares.  May use diazepam longer acting benzodiazepine as needed for stress/anxiety. Don't take and drive. Start at 1/2 tablet.  Continue monitoring blood pressures.  Keep updated with how you're doing.  Keep physical for August.

## 2021-04-10 NOTE — Assessment & Plan Note (Signed)
See above: continue hydroxyzine PRN trial valium PRN.

## 2021-04-10 NOTE — Assessment & Plan Note (Signed)
Congratulated on weight loss to date - he's managed with diet changes.  He wants to avoid prednisone given marked weight gain while on it.

## 2021-04-10 NOTE — Assessment & Plan Note (Signed)
Situational anxiety when he gets psoriatic arthritis flare, as well as related to high family and work stressors.  He desires to avoid daily medication if possible.  Discussed managing with PRN hydroxyzine which he currently does, will also try out valium PRN hopeful for some muscle relaxant effect. Reviewed controlled substance nature with increased regulations, as well as possible habit forming nature of medication, dependence/tolerance, and addiction potential.  Encouraged ongoing healthy stress relieving strategies. Update with effect.

## 2021-04-10 NOTE — Assessment & Plan Note (Signed)
Seems largely situational related to arthritis flares - will stop ACEI, trial metoprolol 25mg  BID PRN BP >150/100 when in acute arthritis flare. I did ask him to continue monitoring home blood pressures, let me know if consistently >140/90

## 2021-04-10 NOTE — Assessment & Plan Note (Signed)
Established with rheum Dr Allena Katz.  Has been unable to tolerate treatments to date. Managing with diet changes.  Encouraged rheum f/u to further discuss other possible options.

## 2021-04-27 ENCOUNTER — Encounter: Payer: Self-pay | Admitting: Family Medicine

## 2021-04-27 MED ORDER — HYDROXYZINE HCL 10 MG PO TABS: 10.0000 mg | ORAL_TABLET | Freq: Two times a day (BID) | ORAL | 3 refills | Status: AC | PRN

## 2021-05-30 ENCOUNTER — Other Ambulatory Visit: Payer: Self-pay | Admitting: Family Medicine

## 2021-05-30 DIAGNOSIS — Z1159 Encounter for screening for other viral diseases: Secondary | ICD-10-CM

## 2021-05-30 DIAGNOSIS — Z125 Encounter for screening for malignant neoplasm of prostate: Secondary | ICD-10-CM

## 2021-05-30 DIAGNOSIS — E785 Hyperlipidemia, unspecified: Secondary | ICD-10-CM

## 2021-05-30 DIAGNOSIS — L4050 Arthropathic psoriasis, unspecified: Secondary | ICD-10-CM

## 2021-05-30 DIAGNOSIS — I10 Essential (primary) hypertension: Secondary | ICD-10-CM

## 2021-05-31 ENCOUNTER — Other Ambulatory Visit: Payer: BC Managed Care – PPO

## 2021-06-07 ENCOUNTER — Encounter: Payer: BC Managed Care – PPO | Admitting: Family Medicine

## 2021-08-27 ENCOUNTER — Encounter: Payer: Self-pay | Admitting: Family Medicine

## 2021-08-27 NOTE — Telephone Encounter (Signed)
Lisinopril not on current medication list.

## 2021-08-30 DIAGNOSIS — Z136 Encounter for screening for cardiovascular disorders: Secondary | ICD-10-CM | POA: Diagnosis not present

## 2021-08-30 DIAGNOSIS — I10 Essential (primary) hypertension: Secondary | ICD-10-CM | POA: Diagnosis not present

## 2021-08-30 DIAGNOSIS — Z713 Dietary counseling and surveillance: Secondary | ICD-10-CM | POA: Diagnosis not present

## 2021-08-30 DIAGNOSIS — Z6831 Body mass index (BMI) 31.0-31.9, adult: Secondary | ICD-10-CM | POA: Diagnosis not present

## 2021-08-30 DIAGNOSIS — Z13220 Encounter for screening for lipoid disorders: Secondary | ICD-10-CM | POA: Diagnosis not present

## 2021-08-30 DIAGNOSIS — Z131 Encounter for screening for diabetes mellitus: Secondary | ICD-10-CM | POA: Diagnosis not present

## 2021-09-02 ENCOUNTER — Other Ambulatory Visit: Payer: Self-pay | Admitting: Family Medicine

## 2021-09-03 NOTE — Telephone Encounter (Signed)
Left message on voicemail for patient to call the office back. 

## 2021-09-03 NOTE — Telephone Encounter (Signed)
Plz call: As of last discussion 03/2021 we had stopped lisinopril and changed BP med to metoprolol 25mg  twice daily as needed for elevated readings. Is he now taking lisinopril daily? If so, how is he using metoprolol?

## 2021-09-07 MED ORDER — LISINOPRIL 10 MG PO TABS: 10.0000 mg | ORAL_TABLET | Freq: Every day | ORAL | 1 refills | Status: DC

## 2021-09-07 NOTE — Telephone Encounter (Signed)
Left another message on voicemail for patient to call the office back. 

## 2021-09-07 NOTE — Addendum Note (Signed)
Addended by: Eustaquio Boyden on: 09/07/2021 02:10 PM   Modules accepted: Orders

## 2021-09-09 NOTE — Telephone Encounter (Signed)
Lvm asking pt to call back. Need to relay Dr. Timoteo Expose message and get answers daily.

## 2021-09-14 NOTE — Telephone Encounter (Signed)
Refill sent 09/07/21, #90/1.

## 2021-09-28 IMAGING — CR DG CHEST 2V
1 series · 2 of 2 positions shown · non-contrast
Comparison: 4018

CLINICAL DATA: Chest pressure

EXAM:
CHEST - 2 VIEW

[Series 1: dg chest 2 view · 0.14mm/px · 2 of 2 slices shown]
[im 1/2]
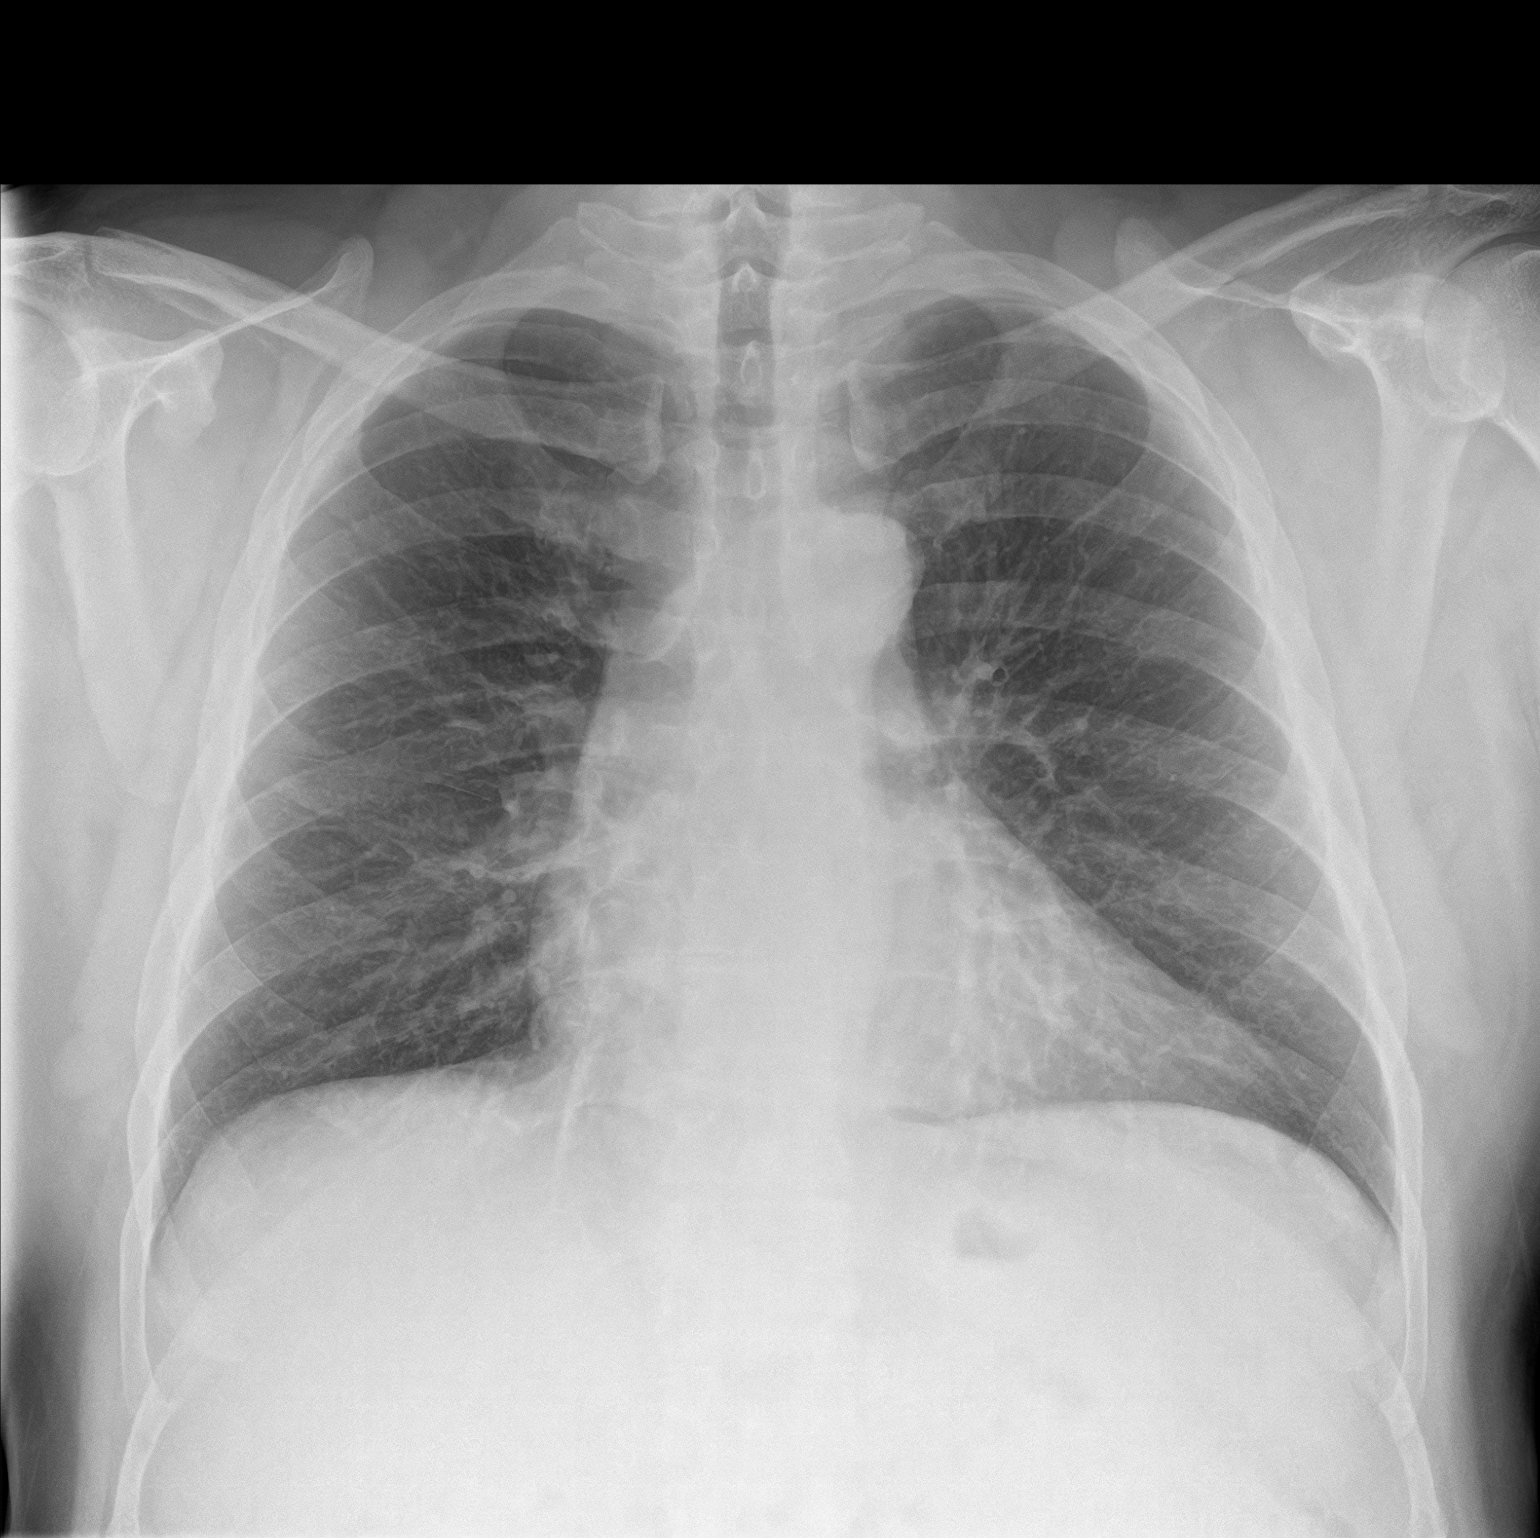
[im 2/2]
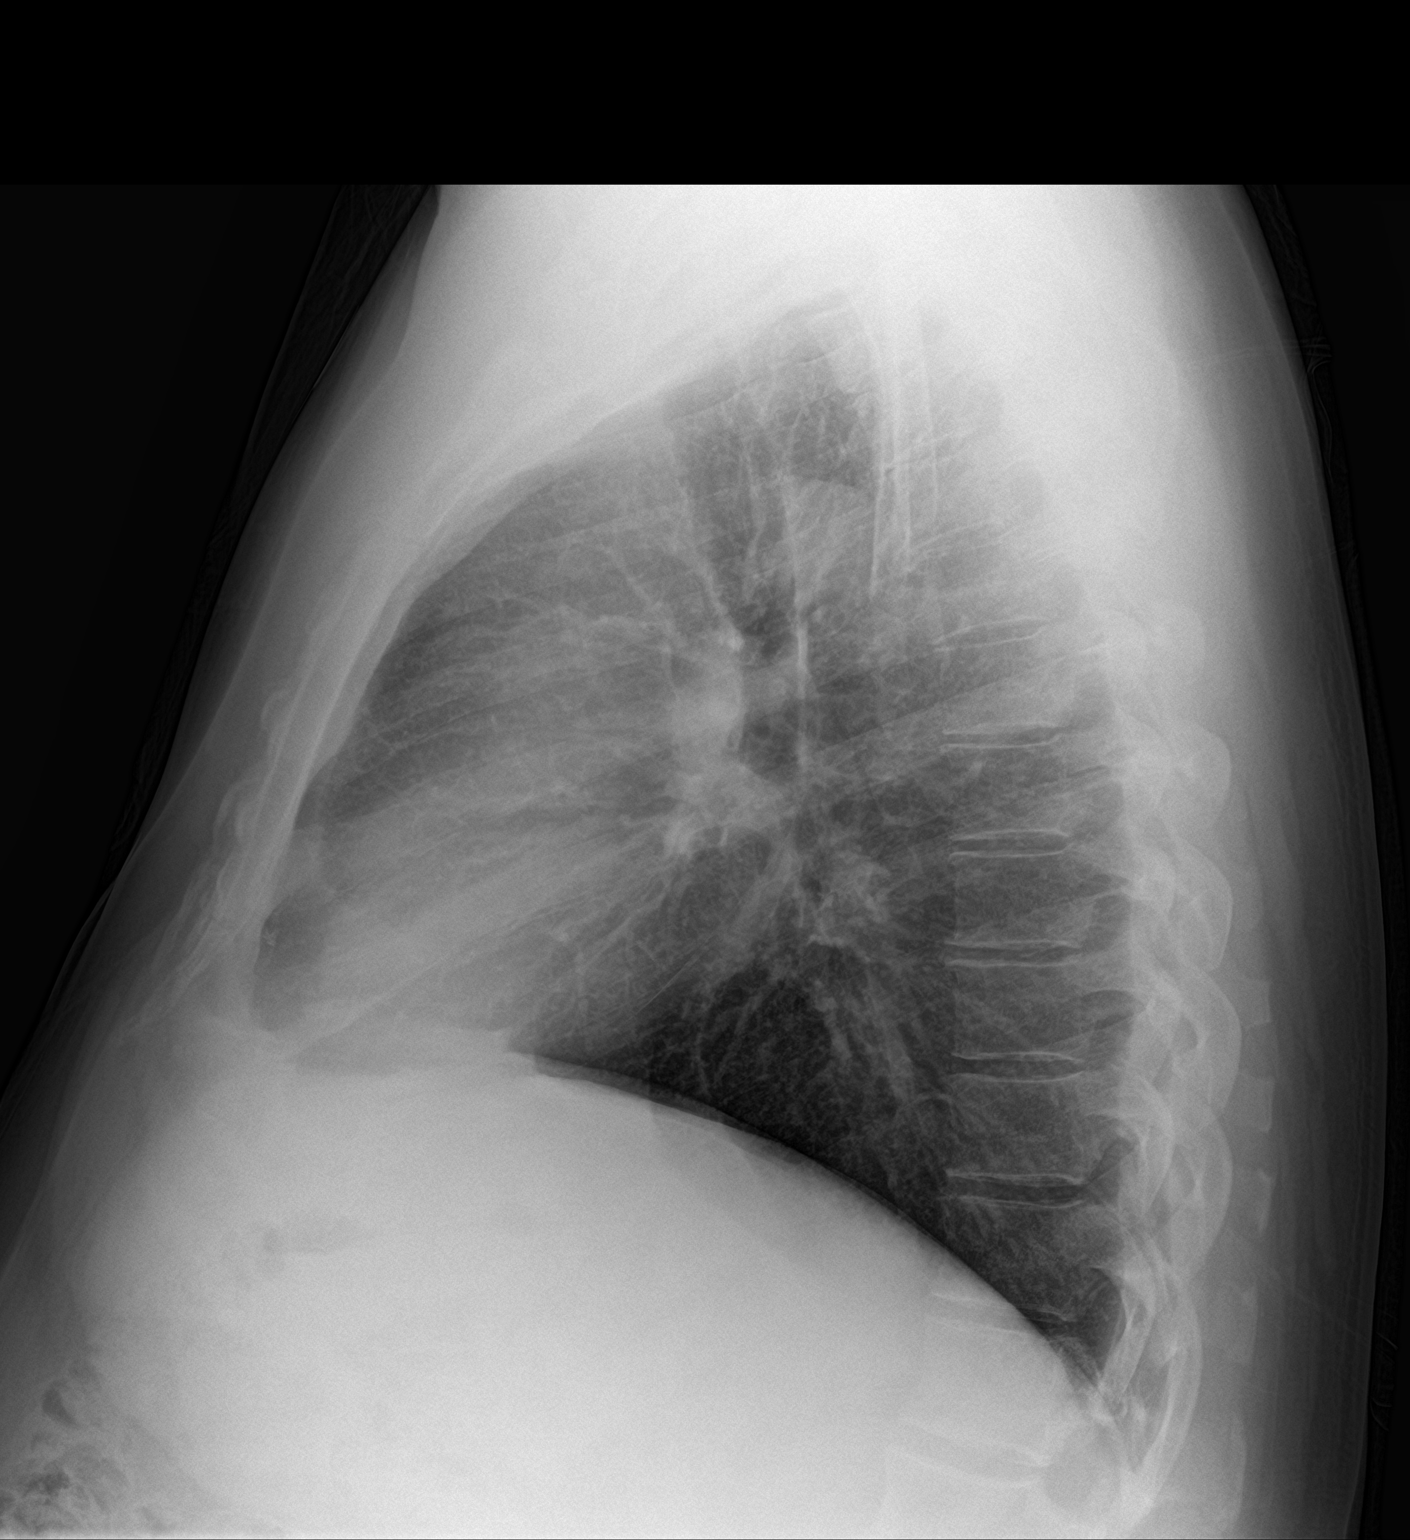

[2 of 2 positions shown; findings below may reference images not displayed]

FINDINGS: The heart size and mediastinal contours are within normal limits.
Both lungs are clear. No pleural effusion or pneumothorax. The
visualized skeletal structures are unremarkable.
IMPRESSION: No acute process in the chest.

## 2022-07-15 DIAGNOSIS — Z713 Dietary counseling and surveillance: Secondary | ICD-10-CM | POA: Diagnosis not present

## 2022-07-15 DIAGNOSIS — Z13220 Encounter for screening for lipoid disorders: Secondary | ICD-10-CM | POA: Diagnosis not present

## 2022-07-15 DIAGNOSIS — I10 Essential (primary) hypertension: Secondary | ICD-10-CM | POA: Diagnosis not present

## 2022-07-15 DIAGNOSIS — Z131 Encounter for screening for diabetes mellitus: Secondary | ICD-10-CM | POA: Diagnosis not present

## 2022-07-15 DIAGNOSIS — Z6828 Body mass index (BMI) 28.0-28.9, adult: Secondary | ICD-10-CM | POA: Diagnosis not present

## 2022-10-17 ENCOUNTER — Other Ambulatory Visit: Payer: Self-pay | Admitting: Family Medicine

## 2022-10-17 DIAGNOSIS — D751 Secondary polycythemia: Secondary | ICD-10-CM

## 2022-10-17 DIAGNOSIS — Z125 Encounter for screening for malignant neoplasm of prostate: Secondary | ICD-10-CM

## 2022-10-17 DIAGNOSIS — E785 Hyperlipidemia, unspecified: Secondary | ICD-10-CM

## 2022-10-17 DIAGNOSIS — Z1159 Encounter for screening for other viral diseases: Secondary | ICD-10-CM

## 2022-10-18 ENCOUNTER — Other Ambulatory Visit: Payer: BC Managed Care – PPO

## 2022-10-25 ENCOUNTER — Encounter: Payer: Self-pay | Admitting: Family Medicine

## 2022-10-25 ENCOUNTER — Ambulatory Visit (INDEPENDENT_AMBULATORY_CARE_PROVIDER_SITE_OTHER): Payer: BC Managed Care – PPO | Admitting: Family Medicine

## 2022-10-25 VITALS — BP 160/98 | HR 94 | Temp 97.2°F | Ht 68.0 in | Wt 198.0 lb

## 2022-10-25 DIAGNOSIS — Z0000 Encounter for general adult medical examination without abnormal findings: Secondary | ICD-10-CM | POA: Diagnosis not present

## 2022-10-25 DIAGNOSIS — Z1159 Encounter for screening for other viral diseases: Secondary | ICD-10-CM

## 2022-10-25 DIAGNOSIS — I10 Essential (primary) hypertension: Secondary | ICD-10-CM

## 2022-10-25 DIAGNOSIS — R202 Paresthesia of skin: Secondary | ICD-10-CM | POA: Diagnosis not present

## 2022-10-25 DIAGNOSIS — Z125 Encounter for screening for malignant neoplasm of prostate: Secondary | ICD-10-CM | POA: Diagnosis not present

## 2022-10-25 DIAGNOSIS — L4050 Arthropathic psoriasis, unspecified: Secondary | ICD-10-CM

## 2022-10-25 DIAGNOSIS — Z0001 Encounter for general adult medical examination with abnormal findings: Secondary | ICD-10-CM

## 2022-10-25 DIAGNOSIS — E669 Obesity, unspecified: Secondary | ICD-10-CM

## 2022-10-25 DIAGNOSIS — D751 Secondary polycythemia: Secondary | ICD-10-CM | POA: Diagnosis not present

## 2022-10-25 DIAGNOSIS — Z23 Encounter for immunization: Secondary | ICD-10-CM | POA: Diagnosis not present

## 2022-10-25 DIAGNOSIS — F410 Panic disorder [episodic paroxysmal anxiety] without agoraphobia: Secondary | ICD-10-CM

## 2022-10-25 DIAGNOSIS — F4323 Adjustment disorder with mixed anxiety and depressed mood: Secondary | ICD-10-CM

## 2022-10-25 DIAGNOSIS — Z880 Allergy status to penicillin: Secondary | ICD-10-CM

## 2022-10-25 DIAGNOSIS — K9041 Non-celiac gluten sensitivity: Secondary | ICD-10-CM

## 2022-10-25 DIAGNOSIS — R1084 Generalized abdominal pain: Secondary | ICD-10-CM

## 2022-10-25 DIAGNOSIS — E785 Hyperlipidemia, unspecified: Secondary | ICD-10-CM

## 2022-10-25 MED ORDER — LISINOPRIL 10 MG PO TABS: 10.0000 mg | ORAL_TABLET | Freq: Every day | ORAL | 3 refills | Status: AC | PRN

## 2022-10-25 NOTE — Assessment & Plan Note (Addendum)
Preventative protocols reviewed and updated unless pt declined. Discussed healthy diet and lifestyle.  

## 2022-10-25 NOTE — Assessment & Plan Note (Signed)
FLP pending, not on statin.  The 10-year ASCVD risk score (Arnett DK, et al., 2019) is: 17.1%   Values used to calculate the score:     Age: 52 years     Sex: Male     Is Non-Hispanic African American: No     Diabetic: No     Tobacco smoker: No     Systolic Blood Pressure: 037 mmHg     Is BP treated: Yes     HDL Cholesterol: 20 mg/dL     Total Cholesterol: 189 mg/dL

## 2022-10-25 NOTE — Progress Notes (Signed)
Patient ID: Francisco Mullen, male    DOB: 01/16/1971, 52 y.o.   MRN: [id number]  This visit was conducted in person.  BP (!) 160/98 (BP Location: Left Arm, Patient Position: Sitting)   Pulse 94   Temp (!) 97.2 F (36.2 C) (Skin)   Ht 5\' 8"  (1.727 m)   Wt 198 lb (89.8 kg)   SpO2 98%   BMI 30.11 kg/m   170/118 on retesting, L arm  CC: CPE Subjective:   HPI: Francisco Mullen is a 52 y.o. male presenting on 10/25/2022 for Annual Exam   HTN - he uses lisinopril 10mg  PRN psoriatic arthritis flares which is when BP rises, otherwise notes normal blood pressure readings. No flushing or headaches with this. When feeling well, bp 130/80s.   Psoriatic arthritis - saw rheumatology Dr Posey Pronto, tried Celebrex and Enbrel but unable to tolerate treatments. He's managed symptoms with diet changes - avoiding wheat, additives/preservatives, sugars.  20 lb weight loss over the past 18 months, total 45 lbs in 3 years.  He's really limited in diet due to gluten intolerance- notes body aches, nausea, arthralgias and itchy thicker skin rash (?eczema vs psoriasis) when he eats gluten.   Notes occasional sharp electrical jolts to different parts of body. Intermittent paresthesias. No numbness, weakness.   He was told he was very ill as a child and was on multiple medications since infant age to 50yo. Now wonders about verity of this, and worries about long term effects of being on these medications as a child.   Preventative: COLONOSCOPY 04/2017 - diverticulosis, rpt 10 yrs Ardis Hughs). He does have fmhx colon cancer in father.  Prostate cancer screening - yearly PSA, no nocturia.  Lung cancer screening - not eligible Flu shot - declines Tdap 2012.  Hep A/B 2015 - completed course  Shingrix - declines Seat belt use discussed  Sunscreen use discussed. No changing moles on skin.  Ex smoker - quit 2007 - rare cigarette PRN anxiety Alcohol - none  Dentist hasn't seen Eye exam - hasn't seen - wears readers    Caffeine: diet sodas  Lives with wife Estill Bamberg), 2 sons and daughter, 1 dog Occupation: IT Education officer, community  Edu: college  Activity: no regular exercise in winter, active in summer Diet: diet drinks, water, fruits/vegetables daily      Relevant past medical, surgical, family and social history reviewed and updated as indicated. Interim medical history since our last visit reviewed. Allergies and medications reviewed and updated. Outpatient Medications Prior to Visit  Medication Sig Dispense Refill   acetaminophen (TYLENOL) 500 MG tablet Take 1,000 mg by mouth every 6 (six) hours as needed.     Ascorbic Acid (VITAMIN C) 1000 MG tablet Take 1,000 mg by mouth daily.     Cholecalciferol (VITAMIN D3) 25 MCG (1000 UT) CAPS Take 1 capsule (1,000 Units total) by mouth daily. 30 capsule    diazepam (VALIUM) 5 MG tablet Take 0.5-1 tablets (2.5-5 mg total) by mouth every 12 (twelve) hours as needed for anxiety (sedation precautions). 30 tablet 0   EPINEPHrine 0.3 mg/0.3 mL IJ SOAJ injection Inject 0.3 mLs (0.3 mg total) into the muscle as needed for anaphylaxis. 2 each 1   hydrOXYzine (ATARAX/VISTARIL) 10 MG tablet Take 1 tablet (10 mg total) by mouth 2 (two) times daily as needed for anxiety. 30 tablet 3   LORazepam (ATIVAN) 1 MG tablet Take 0.5-1 tablets (0.5-1 mg total) by mouth daily as needed for anxiety (plane ride). 4  tablet    Multiple Vitamin (MULTIVITAMIN) tablet Take 1 tablet by mouth daily.     b complex vitamins tablet Take 1 tablet by mouth daily.     lisinopril (ZESTRIL) 10 MG tablet Take 1 tablet (10 mg total) by mouth daily. 90 tablet 1   No facility-administered medications prior to visit.     Per HPI unless specifically indicated in ROS section below Review of Systems  Constitutional:  Positive for fatigue and fever (recent flu like illness). Negative for activity change, appetite change, chills and unexpected weight change.  HENT:  Positive for congestion. Negative for  hearing loss.   Eyes:  Negative for visual disturbance.  Respiratory:  Positive for cough. Negative for chest tightness, shortness of breath and wheezing.        Recent flu like illness  Cardiovascular:  Negative for chest pain, palpitations and leg swelling.  Gastrointestinal:  Negative for abdominal distention, abdominal pain, blood in stool, constipation, diarrhea, nausea and vomiting.  Genitourinary:  Negative for difficulty urinating and hematuria.  Musculoskeletal:  Positive for arthralgias. Negative for myalgias and neck pain.  Skin:  Negative for rash.  Neurological:  Positive for headaches. Negative for dizziness, seizures and syncope.  Hematological:  Negative for adenopathy. Does not bruise/bleed easily.  Psychiatric/Behavioral:  Negative for dysphoric mood. The patient is not nervous/anxious.     Objective:  BP (!) 160/98 (BP Location: Left Arm, Patient Position: Sitting)   Pulse 94   Temp (!) 97.2 F (36.2 C) (Skin)   Ht 5\' 8"  (1.727 m)   Wt 198 lb (89.8 kg)   SpO2 98%   BMI 30.11 kg/m   Wt Readings from Last 3 Encounters:  10/25/22 198 lb (89.8 kg)  04/09/21 217 lb 3 oz (98.5 kg)  02/10/20 244 lb 8 oz (110.9 kg)      Physical Exam Vitals and nursing note reviewed.  Constitutional:      General: He is not in acute distress.    Appearance: Normal appearance. He is well-developed. He is not ill-appearing.  HENT:     Head: Normocephalic and atraumatic.     Right Ear: Hearing, tympanic membrane, ear canal and external ear normal.     Left Ear: Hearing, tympanic membrane, ear canal and external ear normal.     Mouth/Throat:     Mouth: Mucous membranes are moist.     Pharynx: Oropharynx is clear. No oropharyngeal exudate or posterior oropharyngeal erythema.  Eyes:     General: No scleral icterus.    Extraocular Movements: Extraocular movements intact.     Conjunctiva/sclera: Conjunctivae normal.     Pupils: Pupils are equal, round, and reactive to light.  Neck:      Thyroid: No thyroid mass or thyromegaly.  Cardiovascular:     Rate and Rhythm: Normal rate and regular rhythm.     Pulses: Normal pulses.          Radial pulses are 2+ on the right side and 2+ on the left side.     Heart sounds: Normal heart sounds. No murmur heard. Pulmonary:     Effort: Pulmonary effort is normal. No respiratory distress.     Breath sounds: Normal breath sounds. No wheezing, rhonchi or rales.  Abdominal:     General: Bowel sounds are normal. There is no distension.     Palpations: Abdomen is soft. There is no mass.     Tenderness: There is no abdominal tenderness. There is no guarding or rebound.  Hernia: No hernia is present.  Musculoskeletal:        General: Normal range of motion.     Cervical back: Normal range of motion and neck supple.     Right lower leg: No edema.     Left lower leg: No edema.  Lymphadenopathy:     Cervical: No cervical adenopathy.  Skin:    General: Skin is warm and dry.     Findings: No rash.  Neurological:     General: No focal deficit present.     Mental Status: He is alert and oriented to person, place, and time.  Psychiatric:        Mood and Affect: Mood normal.        Behavior: Behavior normal.        Thought Content: Thought content normal.        Judgment: Judgment normal.       Results for orders placed or performed in visit on 02/10/20  RNP Antibody  Result Value Ref Range   Ribonucleic Protein(ENA) Antibody, IgG <1.0 NEG <1.0 NEG AI  Anti-DNA antibody, double-stranded  Result Value Ref Range   ds DNA Ab 2 IU/mL    Assessment & Plan:   Problem List Items Addressed This Visit     Encounter for general adult medical examination with abnormal findings - Primary (Chronic)    Preventative protocols reviewed and updated unless pt declined. Discussed healthy diet and lifestyle.       Obesity, Class I, BMI 30-34.9    Congratulated on continued weight loss noted. He has lost 45 lbs over the past 3 years.        Hypertension    Situational, related to flares of psoriatic arthritis.  Discussed trying lisinopril PRN when bp rises.       Relevant Medications   lisinopril (ZESTRIL) 10 MG tablet   Dyslipidemia    FLP pending, not on statin.  The 10-year ASCVD risk score (Arnett DK, et al., 2019) is: 17.1%   Values used to calculate the score:     Age: 89 years     Sex: Male     Is Non-Hispanic African American: No     Diabetic: No     Tobacco smoker: No     Systolic Blood Pressure: 992 mmHg     Is BP treated: Yes     HDL Cholesterol: 20 mg/dL     Total Cholesterol: 189 mg/dL       Situational mixed anxiety and depressive disorder    Situational anxiety related to flares of psoriatic arthritis, with new situational depression surrounding recent investigation into childhood illnesses. Discussed counseling option - will refer.  I did ask him to send me names of medications he took as a child to investigate into possible future health implications.       Relevant Orders   Ambulatory referral to Psychology   Psoriatic arthritis ([place])    Diagnosed by [facility], did not tolerate meds tried (MTX, Enbrel, Celebrex). Now largely manages with healthy diet choices.       Anxiety attack    Exacerbated with psoriatic arthritis exacerbations, uses hydroxyzine and valium PRN, sparingly.       Polycythemia    Update CBC       Gluten intolerance    Describes symptoms suspicious for gluten allergy - headache, nausea, malaise, arthralgias and rash when he eats wheat. Will check for celiac disease with labwork today as well as CBC, B12.  He already  avoids gluten as is.       Relevant Orders   Celiac Pnl 2 rflx Endomysial Ab Ttr   History of penicillin allergy    Carries this allergy since childhood, now wonders if true allergy.  Discussed possible future allergy referral for evaluation.       Paresthesias    Paresthesias with occasional electrical shock sensation throughout body.  Check vit B12, B1.       Relevant Orders   Vitamin B12   Vitamin B1   Other Visit Diagnoses     Need for hepatitis C screening test       Need for Tdap vaccination       Relevant Orders   Tdap vaccine greater than or equal to 7yo IM (Completed)   Special screening for malignant neoplasm of prostate       Generalized abdominal pain       Relevant Orders   Celiac Pnl 2 rflx Endomysial Ab Ttr        Meds ordered this encounter  Medications   lisinopril (ZESTRIL) 10 MG tablet    Sig: Take 1 tablet (10 mg total) by mouth daily as needed (arthritis flare for blood pressure control).    Dispense:  30 tablet    Refill:  3   Orders Placed This Encounter  Procedures   Tdap vaccine greater than or equal to 7yo IM   Vitamin B12   Celiac Pnl 2 rflx Endomysial Ab Ttr    Standing Status:   Future    Standing Expiration Date:   10/26/2023   Vitamin B1    Standing Status:   Future    Standing Expiration Date:   10/26/2023   Ambulatory referral to Psychology    Referral Priority:   Routine    Referral Type:   Psychiatric    Referral Reason:   Specialty Services Required    Requested Specialty:   Psychology    Number of Visits Requested:   1    Patient instructions: Tdap today Will see if we can add B1 and B12 vitamins to blood drawn. If not, we may bring you back for blood work.  We will refer you to counselor Clydie Braun.  Send me names of meds to look into.  BP was high today - limit caffeine and salt/sodium intake. Keep monitoring BP at home, let me know if consistently >140/90. Use lisinopril as needed during arthritis flares.  Good to see you today Return in 1 year for physical, and as needed.   Follow up plan: Return in about 1 year (around 10/26/2023) for annual exam, prior fasting for blood work.  Eustaquio Boyden, MD

## 2022-10-25 NOTE — Assessment & Plan Note (Signed)
Exacerbated with psoriatic arthritis exacerbations, uses hydroxyzine and valium PRN, sparingly.

## 2022-10-25 NOTE — Patient Instructions (Addendum)
Tdap today Will see if we can add B1 and B12 vitamins to blood drawn. If not, we may bring you back for blood work.  We will refer you to counselor Santiago Glad.  Send me names of meds to look into.  BP was high today - limit caffeine and salt/sodium intake. Keep monitoring BP at home, let me know if consistently >140/90. Use lisinopril as needed during arthritis flares.  Good to see you today Return in 1 year for physical, and as needed.   Health Maintenance, Male Adopting a healthy lifestyle and getting preventive care are important in promoting health and wellness. Ask your health care provider about: The right schedule for you to have regular tests and exams. Things you can do on your own to prevent diseases and keep yourself healthy. What should I know about diet, weight, and exercise? Eat a healthy diet  Eat a diet that includes plenty of vegetables, fruits, low-fat dairy products, and lean protein. Do not eat a lot of foods that are high in solid fats, added sugars, or sodium. Maintain a healthy weight Body mass index (BMI) is a measurement that can be used to identify possible weight problems. It estimates body fat based on height and weight. Your health care provider can help determine your BMI and help you achieve or maintain a healthy weight. Get regular exercise Get regular exercise. This is one of the most important things you can do for your health. Most adults should: Exercise for at least 150 minutes each week. The exercise should increase your heart rate and make you sweat (moderate-intensity exercise). Do strengthening exercises at least twice a week. This is in addition to the moderate-intensity exercise. Spend less time sitting. Even light physical activity can be beneficial. Watch cholesterol and blood lipids Have your blood tested for lipids and cholesterol at 54 years of age, then have this test every 5 years. You may need to have your cholesterol levels checked more often  if: Your lipid or cholesterol levels are high. You are older than 54 years of age. You are at high risk for heart disease. What should I know about cancer screening? Many types of cancers can be detected early and may often be prevented. Depending on your health history and family history, you may need to have cancer screening at various ages. This may include screening for: Colorectal cancer. Prostate cancer. Skin cancer. Lung cancer. What should I know about heart disease, diabetes, and high blood pressure? Blood pressure and heart disease High blood pressure causes heart disease and increases the risk of stroke. This is more likely to develop in people who have high blood pressure readings or are overweight. Talk with your health care provider about your target blood pressure readings. Have your blood pressure checked: Every 3-5 years if you are 80-20 years of age. Every year if you are 73 years old or older. If you are between the ages of 77 and 51 and are a current or former smoker, ask your health care provider if you should have a one-time screening for abdominal aortic aneurysm (AAA). Diabetes Have regular diabetes screenings. This checks your fasting blood sugar level. Have the screening done: Once every three years after age 12 if you are at a normal weight and have a low risk for diabetes. More often and at a younger age if you are overweight or have a high risk for diabetes. What should I know about preventing infection? Hepatitis B If you have a higher risk  for hepatitis B, you should be screened for this virus. Talk with your health care provider to find out if you are at risk for hepatitis B infection. Hepatitis C Blood testing is recommended for: Everyone born from [birth year] through 1967. Anyone with known risk factors for hepatitis C. Sexually transmitted infections (STIs) You should be screened each year for STIs, including gonorrhea and chlamydia, if: You are sexually  active and are younger than 54 years of age. You are older than 54 years of age and your health care provider tells you that you are at risk for this type of infection. Your sexual activity has changed since you were last screened, and you are at increased risk for chlamydia or gonorrhea. Ask your health care provider if you are at risk. Ask your health care provider about whether you are at high risk for HIV. Your health care provider may recommend a prescription medicine to help prevent HIV infection. If you choose to take medicine to prevent HIV, you should first get tested for HIV. You should then be tested every 3 months for as long as you are taking the medicine. Follow these instructions at home: Alcohol use Do not drink alcohol if your health care provider tells you not to drink. If you drink alcohol: Limit how much you have to 0-2 drinks a day. Know how much alcohol is in your drink. In the [place] one drink equals one 12 oz bottle of beer (355 mL), one 5 oz glass of wine (148 mL), or one 1 oz glass of hard liquor (44 mL). Lifestyle Do not use any products that contain nicotine or tobacco. These products include cigarettes, chewing tobacco, and vaping devices, such as e-cigarettes. If you need help quitting, ask your health care provider. Do not use street drugs. Do not share needles. Ask your health care provider for help if you need support or information about quitting drugs. General instructions Schedule regular health, dental, and eye exams. Stay current with your vaccines. Tell your health care provider if: You often feel depressed. You have ever been abused or do not feel safe at home. Summary Adopting a healthy lifestyle and getting preventive care are important in promoting health and wellness. Follow your health care provider's instructions about healthy diet, exercising, and getting tested or screened for diseases. Follow your health care provider's instructions on  monitoring your cholesterol and blood pressure. This information is not intended to replace advice given to you by your health care provider. Make sure you discuss any questions you have with your health care provider. Document Revised: 03/01/2021 Document Reviewed: 03/01/2021 Elsevier Patient Education  [place].

## 2022-10-26 ENCOUNTER — Other Ambulatory Visit: Payer: BC Managed Care – PPO

## 2022-10-26 DIAGNOSIS — K9041 Non-celiac gluten sensitivity: Secondary | ICD-10-CM | POA: Insufficient documentation

## 2022-10-26 DIAGNOSIS — R202 Paresthesia of skin: Secondary | ICD-10-CM | POA: Insufficient documentation

## 2022-10-26 DIAGNOSIS — Z880 Allergy status to penicillin: Secondary | ICD-10-CM | POA: Insufficient documentation

## 2022-10-26 LAB — CBC WITH DIFFERENTIAL/PLATELET
Basophils Absolute: 0.1 10*3/uL (ref 0.0–0.1)
Basophils Relative: 1.3 % (ref 0.0–3.0)
Eosinophils Absolute: 0.1 10*3/uL (ref 0.0–0.7)
Eosinophils Relative: 2.5 % (ref 0.0–5.0)
HCT: 48.7 % (ref 39.0–52.0)
Hemoglobin: 17 g/dL (ref 13.0–17.0)
Lymphocytes Relative: 30.7 % (ref 12.0–46.0)
Lymphs Abs: 1.6 10*3/uL (ref 0.7–4.0)
MCHC: 34.9 g/dL (ref 30.0–36.0)
MCV: 87.4 fl (ref 78.0–100.0)
Monocytes Absolute: 0.4 10*3/uL (ref 0.1–1.0)
Monocytes Relative: 7.8 % (ref 3.0–12.0)
Neutro Abs: 3 10*3/uL (ref 1.4–7.7)
Neutrophils Relative %: 57.7 % (ref 43.0–77.0)
Platelets: 216 10*3/uL (ref 150.0–400.0)
RBC: 5.57 Mil/uL (ref 4.22–5.81)
RDW: 12.8 % (ref 11.5–15.5)
WBC: 5.1 10*3/uL (ref 4.0–10.5)

## 2022-10-26 LAB — COMPREHENSIVE METABOLIC PANEL
ALT: 27 U/L (ref 0–53)
AST: 19 U/L (ref 0–37)
Albumin: 4.5 g/dL (ref 3.5–5.2)
Alkaline Phosphatase: 67 U/L (ref 39–117)
BUN: 9 mg/dL (ref 6–23)
CO2: 28 mEq/L (ref 19–32)
Calcium: 9.7 mg/dL (ref 8.4–10.5)
Chloride: 104 mEq/L (ref 96–112)
Creatinine, Ser: 0.86 mg/dL (ref 0.40–1.50)
GFR: 98.62 mL/min (ref >60.00)
Glucose, Bld: 105 mg/dL — ABNORMAL HIGH (ref 70–99)
Potassium: 4.2 mEq/L (ref 3.5–5.1)
Sodium: 141 mEq/L (ref 135–145)
Total Bilirubin: 0.7 mg/dL (ref 0.2–1.2)
Total Protein: 7 g/dL (ref 6.0–8.3)

## 2022-10-26 LAB — HEPATITIS C ANTIBODY: Hepatitis C Ab: NONREACTIVE

## 2022-10-26 LAB — LIPID PANEL
Cholesterol: 180 mg/dL (ref 0–200)
HDL: 29.8 mg/dL — ABNORMAL LOW (ref >39.00)
NonHDL: 149.99
Total CHOL/HDL Ratio: 6
Triglycerides: 276 mg/dL — ABNORMAL HIGH (ref 0.0–149.0)
VLDL: 55.2 mg/dL — ABNORMAL HIGH (ref 0.0–40.0)

## 2022-10-26 LAB — PSA: PSA: 0.36 ng/mL (ref 0.10–4.00)

## 2022-10-26 LAB — VITAMIN B12: Vitamin B-12: 580 pg/mL (ref 211–911)

## 2022-10-26 LAB — LDL CHOLESTEROL, DIRECT: Direct LDL: 117 mg/dL

## 2022-10-26 NOTE — Assessment & Plan Note (Signed)
Situational anxiety related to flares of psoriatic arthritis, with new situational depression surrounding recent investigation into childhood illnesses. Discussed counseling option - will refer.  I did ask him to send me names of medications he took as a child to investigate into possible future health implications.

## 2022-10-26 NOTE — Assessment & Plan Note (Signed)
Diagnosed by [facility] neurology, did not tolerate meds tried (MTX, Enbrel, Celebrex). Now largely manages with healthy diet choices.

## 2022-10-26 NOTE — Assessment & Plan Note (Signed)
Paresthesias with occasional electrical shock sensation throughout body. Check vit B12, B1.

## 2022-10-26 NOTE — Assessment & Plan Note (Addendum)
Situational, related to flares of psoriatic arthritis.  Discussed trying lisinopril PRN when bp rises.

## 2022-10-26 NOTE — Assessment & Plan Note (Signed)
Carries this allergy since childhood, now wonders if true allergy.  Discussed possible future allergy referral for evaluation.

## 2022-10-26 NOTE — Assessment & Plan Note (Signed)
Describes symptoms suspicious for gluten allergy - headache, nausea, malaise, arthralgias and rash when he eats wheat. Will check for celiac disease with labwork today as well as CBC, B12.  He already avoids gluten as is.

## 2022-10-26 NOTE — Assessment & Plan Note (Signed)
Congratulated on continued weight loss noted. He has lost 45 lbs over the past 3 years.

## 2022-10-26 NOTE — Assessment & Plan Note (Signed)
Update CBC. 

## 2022-10-28 ENCOUNTER — Other Ambulatory Visit (INDEPENDENT_AMBULATORY_CARE_PROVIDER_SITE_OTHER): Payer: BC Managed Care – PPO

## 2022-10-28 DIAGNOSIS — K9041 Non-celiac gluten sensitivity: Secondary | ICD-10-CM | POA: Diagnosis not present

## 2022-10-28 DIAGNOSIS — R1084 Generalized abdominal pain: Secondary | ICD-10-CM | POA: Diagnosis not present

## 2022-10-28 DIAGNOSIS — R202 Paresthesia of skin: Secondary | ICD-10-CM

## 2022-11-03 LAB — CELIAC PNL 2 RFLX ENDOMYSIAL AB TTR
(tTG) Ab, IgA: <1 U/mL
(tTG) Ab, IgG: <1 U/mL
Endomysial Ab IgA: NEGATIVE
Gliadin IgA: <1 U/mL
Gliadin IgG: <1 U/mL
Immunoglobulin A: 153 mg/dL (ref 47–310)

## 2022-11-03 LAB — VITAMIN B1: Vitamin B1 (Thiamine): 9 nmol/L (ref 8–30)

## 2023-09-06 ENCOUNTER — Encounter: Payer: Self-pay | Admitting: Family Medicine

## 2023-09-06 NOTE — Telephone Encounter (Signed)
Filled and returned to [facility]

## 2023-09-06 NOTE — Telephone Encounter (Signed)
Form in your box for review.

## 2023-09-08 NOTE — Telephone Encounter (Signed)
Form faxed

## 2023-09-15 NOTE — Telephone Encounter (Signed)
Printed and re-faxed form to [employer] at [phone].

## 2024-11-22 ENCOUNTER — Telehealth: Payer: Self-pay | Admitting: Family Medicine

## 2024-11-22 NOTE — Telephone Encounter (Signed)
 Pt walked in today requesting jury duty excuse  He requests to be out of jury duty as he's caring for his mother.  Letter provided.
